# Patient Record
Sex: Male | Born: 1955 | Race: White | Hispanic: No | Marital: Single
Health system: Southern US, Community
[De-identification: ages and names within clinical notes are randomized; demographics above are authoritative.]

## PROBLEM LIST (undated history)

## (undated) DIAGNOSIS — S129XXA Fracture of neck, unspecified, initial encounter: Secondary | ICD-10-CM

## (undated) DIAGNOSIS — J181 Lobar pneumonia, unspecified organism: Secondary | ICD-10-CM

## (undated) DIAGNOSIS — S069X9A Unspecified intracranial injury with loss of consciousness of unspecified duration, initial encounter: Secondary | ICD-10-CM

## (undated) DIAGNOSIS — S066X9A Traumatic subarachnoid hemorrhage with loss of consciousness of unspecified duration, initial encounter: Secondary | ICD-10-CM

## (undated) DIAGNOSIS — J9621 Acute and chronic respiratory failure with hypoxia: Secondary | ICD-10-CM

---

## 2019-02-17 ENCOUNTER — Other Ambulatory Visit (HOSPITAL_COMMUNITY): Payer: BC Managed Care – PPO

## 2019-02-17 ENCOUNTER — Inpatient Hospital Stay
Admission: AD | Admit: 2019-02-17 | Discharge: 2019-04-16 | Disposition: A | Discharge status: Skilled Nursing Facility | Payer: BC Managed Care – PPO | Source: Other Acute Inpatient Hospital | Attending: Internal Medicine | Admitting: Internal Medicine

## 2019-02-17 DIAGNOSIS — J181 Lobar pneumonia, unspecified organism: Secondary | ICD-10-CM | POA: Diagnosis present

## 2019-02-17 DIAGNOSIS — S069X9A Unspecified intracranial injury with loss of consciousness of unspecified duration, initial encounter: Secondary | ICD-10-CM | POA: Diagnosis present

## 2019-02-17 DIAGNOSIS — S129XXA Fracture of neck, unspecified, initial encounter: Secondary | ICD-10-CM | POA: Diagnosis present

## 2019-02-17 DIAGNOSIS — S066X9A Traumatic subarachnoid hemorrhage with loss of consciousness of unspecified duration, initial encounter: Secondary | ICD-10-CM | POA: Diagnosis present

## 2019-02-17 DIAGNOSIS — J189 Pneumonia, unspecified organism: Secondary | ICD-10-CM

## 2019-02-17 DIAGNOSIS — R509 Fever, unspecified: Secondary | ICD-10-CM

## 2019-02-17 DIAGNOSIS — Z431 Encounter for attention to gastrostomy: Secondary | ICD-10-CM

## 2019-02-17 DIAGNOSIS — T85598A Other mechanical complication of other gastrointestinal prosthetic devices, implants and grafts, initial encounter: Secondary | ICD-10-CM

## 2019-02-17 DIAGNOSIS — J9621 Acute and chronic respiratory failure with hypoxia: Secondary | ICD-10-CM | POA: Diagnosis present

## 2019-02-17 DIAGNOSIS — Z4659 Encounter for fitting and adjustment of other gastrointestinal appliance and device: Secondary | ICD-10-CM

## 2019-02-17 HISTORY — DX: Lobar pneumonia, unspecified organism: J18.1

## 2019-02-17 HISTORY — DX: Fracture of neck, unspecified, initial encounter: S12.9XXA

## 2019-02-17 HISTORY — DX: Acute and chronic respiratory failure with hypoxia: J96.21

## 2019-02-17 HISTORY — DX: Traumatic subarachnoid hemorrhage with loss of consciousness of unspecified duration, initial encounter: S06.6X9A

## 2019-02-17 HISTORY — DX: Unspecified intracranial injury with loss of consciousness of unspecified duration, initial encounter: S06.9X9A

## 2019-02-17 LAB — BLOOD GAS, ARTERIAL
Acid-Base Excess: 0.5 mmol/L (ref 0.0–2.0)
Bicarbonate: 23.8 mmol/L (ref 20.0–28.0)
FIO2: 28
O2 Saturation: 91.7 %
Patient temperature: 98.6
pCO2 arterial: 33.7 mmHg (ref 32.0–48.0)
pH, Arterial: 7.463 — ABNORMAL HIGH (ref 7.350–7.450)
pO2, Arterial: 64.9 mmHg — ABNORMAL LOW (ref 83.0–108.0)

## 2019-02-18 ENCOUNTER — Other Ambulatory Visit (HOSPITAL_COMMUNITY): Payer: BC Managed Care – PPO

## 2019-02-18 LAB — CBC WITH DIFFERENTIAL/PLATELET
Abs Immature Granulocytes: 0.06 10*3/uL (ref 0.00–0.07)
Basophils Absolute: 0 10*3/uL (ref 0.0–0.1)
Basophils Relative: 0 %
Eosinophils Absolute: 0.3 10*3/uL (ref 0.0–0.5)
Eosinophils Relative: 4 %
HCT: 31.8 % — ABNORMAL LOW (ref 39.0–52.0)
Hemoglobin: 10 g/dL — ABNORMAL LOW (ref 13.0–17.0)
Immature Granulocytes: 1 %
Lymphocytes Relative: 13 %
Lymphs Abs: 0.8 10*3/uL (ref 0.7–4.0)
MCH: 27.7 pg (ref 26.0–34.0)
MCHC: 31.4 g/dL (ref 30.0–36.0)
MCV: 88.1 fL (ref 80.0–100.0)
Monocytes Absolute: 0.4 10*3/uL (ref 0.1–1.0)
Monocytes Relative: 6 %
Neutro Abs: 5.1 10*3/uL (ref 1.7–7.7)
Neutrophils Relative %: 76 %
Platelets: 376 10*3/uL (ref 150–400)
RBC: 3.61 MIL/uL — ABNORMAL LOW (ref 4.22–5.81)
RDW: 15.3 % (ref 11.5–15.5)
WBC: 6.7 10*3/uL (ref 4.0–10.5)
nRBC: 0.3 % — ABNORMAL HIGH (ref 0.0–0.2)

## 2019-02-18 LAB — URINALYSIS, ROUTINE W REFLEX MICROSCOPIC
Bilirubin Urine: NEGATIVE
Glucose, UA: NEGATIVE mg/dL
Ketones, ur: NEGATIVE mg/dL
Leukocytes,Ua: NEGATIVE
Nitrite: NEGATIVE
Protein, ur: NEGATIVE mg/dL
Specific Gravity, Urine: 1.025 (ref 1.005–1.030)
pH: 6.5 (ref 5.0–8.0)

## 2019-02-18 LAB — C DIFFICILE QUICK SCREEN W PCR REFLEX
C Diff antigen: NEGATIVE
C Diff interpretation: NOT DETECTED
C Diff toxin: NEGATIVE

## 2019-02-18 LAB — URINALYSIS, MICROSCOPIC (REFLEX)
Bacteria, UA: NONE SEEN
WBC, UA: NONE SEEN WBC/hpf (ref 0–5)

## 2019-02-18 LAB — COMPREHENSIVE METABOLIC PANEL
ALT: 27 U/L (ref 0–44)
AST: 24 U/L (ref 15–41)
Albumin: 2.2 g/dL — ABNORMAL LOW (ref 3.5–5.0)
Alkaline Phosphatase: 88 U/L (ref 38–126)
Anion gap: 9 (ref 5–15)
BUN: 31 mg/dL — ABNORMAL HIGH (ref 8–23)
CO2: 21 mmol/L — ABNORMAL LOW (ref 22–32)
Calcium: 8.4 mg/dL — ABNORMAL LOW (ref 8.9–10.3)
Chloride: 107 mmol/L (ref 98–111)
Creatinine, Ser: 1.33 mg/dL — ABNORMAL HIGH (ref 0.61–1.24)
GFR calc Af Amer: >60 mL/min (ref >60)
GFR calc non Af Amer: 56 mL/min — ABNORMAL LOW (ref >60)
Glucose, Bld: 163 mg/dL — ABNORMAL HIGH (ref 70–99)
Potassium: 4.8 mmol/L (ref 3.5–5.1)
Sodium: 137 mmol/L (ref 135–145)
Total Bilirubin: 0.7 mg/dL (ref 0.3–1.2)
Total Protein: 6.9 g/dL (ref 6.5–8.1)

## 2019-02-18 LAB — MAGNESIUM: Magnesium: 2.4 mg/dL (ref 1.7–2.4)

## 2019-02-18 LAB — HEMOGLOBIN A1C
Hgb A1c MFr Bld: 6.2 % — ABNORMAL HIGH (ref 4.8–5.6)
Mean Plasma Glucose: 131.24 mg/dL

## 2019-02-18 LAB — PROTIME-INR
INR: 1.1 (ref 0.8–1.2)
Prothrombin Time: 14.5 seconds (ref 11.4–15.2)

## 2019-02-18 LAB — PHOSPHORUS: Phosphorus: 4 mg/dL (ref 2.5–4.6)

## 2019-02-19 ENCOUNTER — Encounter: Payer: Self-pay | Admitting: Internal Medicine

## 2019-02-19 DIAGNOSIS — J9621 Acute and chronic respiratory failure with hypoxia: Secondary | ICD-10-CM | POA: Diagnosis not present

## 2019-02-19 DIAGNOSIS — S069X9A Unspecified intracranial injury with loss of consciousness of unspecified duration, initial encounter: Secondary | ICD-10-CM | POA: Diagnosis not present

## 2019-02-19 DIAGNOSIS — S066X9S Traumatic subarachnoid hemorrhage with loss of consciousness of unspecified duration, sequela: Secondary | ICD-10-CM

## 2019-02-19 DIAGNOSIS — J181 Lobar pneumonia, unspecified organism: Secondary | ICD-10-CM | POA: Diagnosis present

## 2019-02-19 DIAGNOSIS — S129XXA Fracture of neck, unspecified, initial encounter: Secondary | ICD-10-CM | POA: Diagnosis present

## 2019-02-19 DIAGNOSIS — S129XXS Fracture of neck, unspecified, sequela: Secondary | ICD-10-CM | POA: Diagnosis not present

## 2019-02-19 DIAGNOSIS — S066X9A Traumatic subarachnoid hemorrhage with loss of consciousness of unspecified duration, initial encounter: Secondary | ICD-10-CM | POA: Diagnosis present

## 2019-02-19 LAB — URINE CULTURE: Culture: NO GROWTH

## 2019-02-19 LAB — VANCOMYCIN, TROUGH: Vancomycin Tr: 12 ug/mL — ABNORMAL LOW (ref 15–20)

## 2019-02-19 MED ORDER — CARBOXYMETHYLCELLULOSE SODIUM 0.5 % OP SOLN
1.00 | OPHTHALMIC | Status: DC
Start: ? — End: 2019-02-19

## 2019-02-19 MED ORDER — ENOXAPARIN SODIUM 30 MG/0.3ML ~~LOC~~ SOLN
30.00 | SUBCUTANEOUS | Status: DC
Start: 2019-02-17 — End: 2019-02-19

## 2019-02-19 MED ORDER — TERAZOSIN HCL 1 MG PO CAPS
1.00 | ORAL_CAPSULE | ORAL | Status: DC
Start: 2019-02-17 — End: 2019-02-19

## 2019-02-19 MED ORDER — SULFAMETHOXAZOLE-TRIMETHOPRIM 800-160 MG PO TABS
1.00 | ORAL_TABLET | ORAL | Status: DC
Start: 2019-02-17 — End: 2019-02-19

## 2019-02-19 MED ORDER — ATORVASTATIN CALCIUM 10 MG PO TABS
10.00 | ORAL_TABLET | ORAL | Status: DC
Start: 2019-02-17 — End: 2019-02-19

## 2019-02-19 MED ORDER — OXYCODONE-ACETAMINOPHEN 5-325 MG PO TABS
1.00 | ORAL_TABLET | ORAL | Status: DC
Start: ? — End: 2019-02-19

## 2019-02-19 MED ORDER — ARMODAFINIL 150 MG PO TABS
150.00 | ORAL_TABLET | ORAL | Status: DC
Start: 2019-02-18 — End: 2019-02-19

## 2019-02-19 MED ORDER — GENERIC EXTERNAL MEDICATION
Status: DC
Start: 2019-02-17 — End: 2019-02-19

## 2019-02-19 MED ORDER — SULFAMETHOXAZOLE-TRIMETHOPRIM 800-160 MG PO TABS
2.00 | ORAL_TABLET | ORAL | Status: DC
Start: 2019-02-17 — End: 2019-02-19

## 2019-02-19 MED ORDER — INSULIN REGULAR HUMAN 100 UNIT/ML IJ SOLN
1.00 | INTRAMUSCULAR | Status: DC
Start: 2019-02-17 — End: 2019-02-19

## 2019-02-19 MED ORDER — ACETAMINOPHEN 325 MG PO TABS
325.00 | ORAL_TABLET | ORAL | Status: DC
Start: 2019-02-17 — End: 2019-02-19

## 2019-02-19 MED ORDER — GENERIC EXTERNAL MEDICATION
1.00 | Status: DC
Start: 2019-02-17 — End: 2019-02-19

## 2019-02-19 MED ORDER — INSULIN REGULAR HUMAN 100 UNIT/ML IJ SOLN
8.00 | INTRAMUSCULAR | Status: DC
Start: 2019-02-17 — End: 2019-02-19

## 2019-02-19 MED ORDER — GABAPENTIN 100 MG PO CAPS
100.00 | ORAL_CAPSULE | ORAL | Status: DC
Start: 2019-02-17 — End: 2019-02-19

## 2019-02-19 MED ORDER — DEXTROSE 10 % IV SOLN
125.00 | INTRAVENOUS | Status: DC
Start: ? — End: 2019-02-19

## 2019-02-19 MED ORDER — SENNOSIDES-DOCUSATE SODIUM 8.6-50 MG PO TABS
2.00 | ORAL_TABLET | ORAL | Status: DC
Start: 2019-02-17 — End: 2019-02-19

## 2019-02-19 MED ORDER — BROMOCRIPTINE MESYLATE 2.5 MG PO TABS
2.50 | ORAL_TABLET | ORAL | Status: DC
Start: 2019-02-17 — End: 2019-02-19

## 2019-02-19 MED ORDER — CHLORHEXIDINE GLUCONATE 0.12 % MT SOLN
15.00 | OROMUCOSAL | Status: DC
Start: 2019-02-17 — End: 2019-02-19

## 2019-02-19 MED ORDER — PROPRANOLOL HCL 40 MG PO TABS
40.00 | ORAL_TABLET | ORAL | Status: DC
Start: 2019-02-17 — End: 2019-02-19

## 2019-02-19 MED ORDER — ALBUTEROL SULFATE (2.5 MG/3ML) 0.083% IN NEBU
2.50 | INHALATION_SOLUTION | RESPIRATORY_TRACT | Status: DC
Start: ? — End: 2019-02-19

## 2019-02-19 MED ORDER — INSULIN GLARGINE 100 UNIT/ML SOLOSTAR PEN
12.00 | PEN_INJECTOR | SUBCUTANEOUS | Status: DC
Start: 2019-02-17 — End: 2019-02-19

## 2019-02-19 MED ORDER — POLYETHYLENE GLYCOL 3350 17 G PO PACK
17.00 | PACK | ORAL | Status: DC
Start: 2019-02-18 — End: 2019-02-19

## 2019-02-19 MED ORDER — GENERIC EXTERNAL MEDICATION
Status: DC
Start: ? — End: 2019-02-19

## 2019-02-19 MED ORDER — TRAMADOL HCL 50 MG PO TABS
50.00 | ORAL_TABLET | ORAL | Status: DC
Start: ? — End: 2019-02-19

## 2019-02-19 MED ORDER — GENERIC EXTERNAL MEDICATION
40.00 | Status: DC
Start: 2019-02-18 — End: 2019-02-19

## 2019-02-19 NOTE — Consult Note (Signed)
Pulmonary [place]  PULMONARY SERVICE  Date of Service: 02/19/2019  PULMONARY CRITICAL CARE Luke Clayton  Luke Clayton  DOB: [birth year]   DOA: 02/17/2019  Referring Physician: Merton Border, MD  HPI: Luke Clayton is a 63 y.o. male seen for follow up of Acute on Chronic Respiratory Failure.  Patient has medical history significant for hypertension hyperlipidemia morbid obesity came into the hospital after falling from his tractor trailer and suffering multiple injuries.  Patient at that time was sent for altered mental seizure and mental status was altered.  Patient had a prolonged hospital course was seen by neurology initial evaluation had a acute subarachnoid hemorrhage and patient displaced fractures of the cervical spine.  Patient was intubated underwent trauma evaluation and management.  Subsequently failed to extubate required tracheostomy and presents now for further management and weaning in our facility.  Review of Systems:  ROS performed and is unremarkable other than noted above.  Past Medical History:  Diagnosis Date  . Arthritis  . Coronary artery disease  . Gout  . Hypertension   Past Surgical History:  Procedure Laterality Date  . CHOLECYSTECTOMY  . EYE SURGERY   No family history on file. Social History   Socioeconomic History  . Marital status: Not on file  Spouse name: Not on file  . Number of children: Not on file  . Years of education: Not on file  . Highest education level: Not on file  Occupational History  . Not on file  Social Needs  . Financial resource strain: Not on file  . Food insecurity  Worry: Not on file  Inability: Not on file  . Transportation needs  Medical: Not on file  Non-medical: Not on file  Tobacco Use  . Smoking status: Former Research scientist (life sciences)  . Smokeless tobacco: Former Network engineer and Sexual Activity  . Alcohol use: Yes  Frequency: 2-4 times a month  Drinks per session: 1  or 2  Binge frequency: Less than monthly  . Drug use: Not Currently     Medications: Reviewed on Rounds  Physical Exam:  Vitals: Temperature 97.5 pulse 89 respiratory rate 24 blood pressure 129/70 saturations 93%  Ventilator Settings off the ventilator on T collar with a 28% FiO2  . General: Comfortable at this time . Eyes: Grossly normal lids, irises & conjunctiva . ENT: grossly tongue is normal . Neck: no obvious mass . Cardiovascular: S1-S2 normal no gallop . Respiratory: No rhonchi coarse breath sounds . Abdomen: Soft nontender . Skin: no rash seen on limited exam . Musculoskeletal: not rigid . Psychiatric:unable to assess . Neurologic: no seizure no involuntary movements         Labs on Admission:  Basic Metabolic Panel: Recent Labs  Lab 02/18/19 0604  NA 137  K 4.8  CL 107  CO2 21*  GLUCOSE 163*  BUN 31*  CREATININE 1.33*  CALCIUM 8.4*  MG 2.4  PHOS 4.0    Recent Labs  Lab 02/17/19 1745  PHART 7.463*  PCO2ART 33.7  PO2ART 64.9*  HCO3 23.8  O2SAT 91.7    Liver Function Tests: Recent Labs  Lab 02/18/19 0604  AST 24  ALT 27  ALKPHOS 88  BILITOT 0.7  PROT 6.9  ALBUMIN 2.2*   No results for input(s): LIPASE, AMYLASE in the last 168 hours. No results for input(s): AMMONIA in the last 168 hours.  CBC: Recent Labs  Lab 02/18/19 0604  WBC 6.7  NEUTROABS 5.1  HGB  10.0*  HCT 31.8*  MCV 88.1  PLT 376    Cardiac Enzymes: No results for input(s): CKTOTAL, CKMB, CKMBINDEX, TROPONINI in the last 168 hours.  BNP (last 3 results) No results for input(s): BNP in the last 8760 hours.  ProBNP (last 3 results) No results for input(s): PROBNP in the last 8760 hours.   Radiological Exams on Admission: Dg Abd 1 View  Result Date: 02/17/2019 CLINICAL DATA:  Nasogastric tube placement. EXAM: ABDOMEN - 1 VIEW COMPARISON:  None. FINDINGS: The enteric tube projects over the first portion the duodenum. The tip is pointed distally. The bowel gas  pattern is nonobstructive and nonspecific. IMPRESSION: Enteric tube tip projects over the first portion of the duodenum. Electronically Signed   By: Katherine Mantlehristopher  Green M.D.   On: 02/17/2019 18:06   Dg Chest Port 1 View  Result Date: 02/18/2019 CLINICAL DATA:  Pneumonia EXAM: PORTABLE CHEST 1 VIEW COMPARISON:  None. FINDINGS: Left perihilar streaky opacity with obscured left diaphragm. Tracheostomy tube in place. There is a feeding tube which at least reaches the diaphragm. Remote right rib fractures. IMPRESSION: Left basal and perihilar atelectasis or pneumonia. Electronically Signed   By: Marnee SpringJonathon  Watts M.D.   On: 02/18/2019 08:04    Assessment/Plan Active Problems:   Acute on chronic respiratory failure with hypoxia (HCC)   Traumatic subarachnoid hemorrhage with loss of consciousness (HCC)   Lobar pneumonia, unspecified organism (HCC)   Multiple closed fractures of cervical vertebrae (HCC)   Traumatic brain injury with loss of consciousness of one hour or more (HCC)   1. Acute on chronic respiratory failure with hypoxia currently is tolerating the weaning has been on T collar will continue to advance weaning as tolerated right now is requiring 28% FiO2. 2. Subarachnoid hemorrhage cleared by neurology at the other facility.  We will continue with supportive care physical therapy as necessary. 3. Lobar pneumonia last chest x-ray showing some atelectasis versus pneumonia patient has been treated we will follow-up radiologically. 4. Multiple cervical fractures patient remains in c-collar without continue with supportive care 5. Traumatic brain injury will need ongoing follow-up  I have personally seen and evaluated the patient, evaluated laboratory and imaging results, formulated the assessment and plan and placed orders. The Patient requires high complexity decision making for assessment and support.  Case was discussed on Rounds with the Respiratory Therapy Staff Time Spent  70minutes  Yevonne PaxSaadat A Tami Blass, MD [facility] Pulmonary Critical Care Medicine Sleep Medicine

## 2019-02-20 DIAGNOSIS — S129XXS Fracture of neck, unspecified, sequela: Secondary | ICD-10-CM | POA: Diagnosis not present

## 2019-02-20 DIAGNOSIS — J9621 Acute and chronic respiratory failure with hypoxia: Secondary | ICD-10-CM | POA: Diagnosis not present

## 2019-02-20 DIAGNOSIS — J181 Lobar pneumonia, unspecified organism: Secondary | ICD-10-CM | POA: Diagnosis not present

## 2019-02-20 DIAGNOSIS — S069X9A Unspecified intracranial injury with loss of consciousness of unspecified duration, initial encounter: Secondary | ICD-10-CM | POA: Diagnosis not present

## 2019-02-20 LAB — RENAL FUNCTION PANEL
Albumin: 2.1 g/dL — ABNORMAL LOW (ref 3.5–5.0)
Anion gap: 10 (ref 5–15)
BUN: 36 mg/dL — ABNORMAL HIGH (ref 8–23)
CO2: 19 mmol/L — ABNORMAL LOW (ref 22–32)
Calcium: 8.6 mg/dL — ABNORMAL LOW (ref 8.9–10.3)
Chloride: 106 mmol/L (ref 98–111)
Creatinine, Ser: 1.3 mg/dL — ABNORMAL HIGH (ref 0.61–1.24)
GFR calc Af Amer: >60 mL/min (ref >60)
GFR calc non Af Amer: 58 mL/min — ABNORMAL LOW (ref >60)
Glucose, Bld: 183 mg/dL — ABNORMAL HIGH (ref 70–99)
Phosphorus: 3.4 mg/dL (ref 2.5–4.6)
Potassium: 4.7 mmol/L (ref 3.5–5.1)
Sodium: 135 mmol/L (ref 135–145)

## 2019-02-20 LAB — CBC
HCT: 31.7 % — ABNORMAL LOW (ref 39.0–52.0)
Hemoglobin: 9.9 g/dL — ABNORMAL LOW (ref 13.0–17.0)
MCH: 27.7 pg (ref 26.0–34.0)
MCHC: 31.2 g/dL (ref 30.0–36.0)
MCV: 88.8 fL (ref 80.0–100.0)
Platelets: 353 10*3/uL (ref 150–400)
RBC: 3.57 MIL/uL — ABNORMAL LOW (ref 4.22–5.81)
RDW: 16.3 % — ABNORMAL HIGH (ref 11.5–15.5)
WBC: 8.3 10*3/uL (ref 4.0–10.5)
nRBC: 0 % (ref 0.0–0.2)

## 2019-02-20 LAB — CULTURE, RESPIRATORY W GRAM STAIN: Culture: NORMAL

## 2019-02-20 LAB — VANCOMYCIN, TROUGH: Vancomycin Tr: 17 ug/mL (ref 15–20)

## 2019-02-20 LAB — MAGNESIUM: Magnesium: 2.4 mg/dL (ref 1.7–2.4)

## 2019-02-20 NOTE — Progress Notes (Addendum)
Pulmonary Critical Care Medicine [name]   PULMONARY CRITICAL CARE SERVICE  PROGRESS NOTE  Date of Service: 02/20/2019  Luke Clayton  [id number]:[id number]  DOB: May 31, 1956   DOA: 02/17/2019  Referring Physician: Merton Border, MD  HPI: Luke Clayton is a 63 y.o. male seen for follow up of Acute on Chronic Respiratory Failure.  Patient continues on aerosol trach collar 28% FiO2 resting on pressure support at night.  Medications: Reviewed on Rounds  Physical Exam:  Vitals: Pulse 96 respirations 36 BP 145/85 O2 sat 93% temp 99.8  Ventilator Settings ATC 28%  . General: Comfortable at this time . Eyes: Grossly normal lids, irises & conjunctiva . ENT: grossly tongue is normal . Neck: no obvious mass . Cardiovascular: S1 S2 normal no gallop . Respiratory: No rales or rhonchi noted . Abdomen: soft . Skin: no rash seen on limited exam . Musculoskeletal: not rigid . Psychiatric:unable to assess . Neurologic: no seizure no involuntary movements         Lab Data:   Basic Metabolic Panel: Recent Labs  Lab 02/18/19 0604 02/20/19 0208  NA 137 135  K 4.8 4.7  CL 107 106  CO2 21* 19*  GLUCOSE 163* 183*  BUN 31* 36*  CREATININE 1.33* 1.30*  CALCIUM 8.4* 8.6*  MG 2.4 2.4  PHOS 4.0 3.4    ABG: Recent Labs  Lab 02/17/19 1745  PHART 7.463*  PCO2ART 33.7  PO2ART 64.9*  HCO3 23.8  O2SAT 91.7    Liver Function Tests: Recent Labs  Lab 02/18/19 0604 02/20/19 0208  AST 24  --   ALT 27  --   ALKPHOS 88  --   BILITOT 0.7  --   PROT 6.9  --   ALBUMIN 2.2* 2.1*   No results for input(s): LIPASE, AMYLASE in the last 168 hours. No results for input(s): AMMONIA in the last 168 hours.  CBC: Recent Labs  Lab 02/18/19 0604 02/20/19 0208  WBC 6.7 8.3  NEUTROABS 5.1  --   HGB 10.0* 9.9*  HCT 31.8* 31.7*  MCV 88.1 88.8  PLT 376 353    Cardiac Enzymes: No results for input(s): CKTOTAL, CKMB, CKMBINDEX, TROPONINI in the last 168 hours.  BNP (last 3  results) No results for input(s): BNP in the last 8760 hours.  ProBNP (last 3 results) No results for input(s): PROBNP in the last 8760 hours.  Radiological Exams: No results found.  Assessment/Plan Active Problems:   Acute on chronic respiratory failure with hypoxia (HCC)   Traumatic subarachnoid hemorrhage with loss of consciousness (HCC)   Lobar pneumonia, unspecified organism ([place])   Multiple closed fractures of cervical vertebrae (HCC)   Traumatic brain injury with loss of consciousness of one hour or more ([place])   1. Acute on chronic respiratory failure with hypoxia currently is tolerating the weaning has been on T collar will continue to advance weaning as tolerated right now is requiring 28% FiO2. 2. Subarachnoid hemorrhage cleared by neurology at the other facility.  We will continue with supportive care physical therapy as necessary. 3. Lobar pneumonia last chest x-ray showing some atelectasis versus pneumonia patient has been treated we will follow-up radiologically. 4. Multiple cervical fractures patient remains in c-collar without continue with supportive care 5. Traumatic brain injury will need ongoing follow-up   I have personally seen and evaluated the patient, evaluated laboratory and imaging results, formulated the assessment and plan and placed orders. The Patient requires high complexity decision making for assessment and support.  Case was discussed on Rounds with the Respiratory Therapy Staff  Allyne Gee, MD [facility] Pulmonary Critical Care Medicine Sleep Medicine

## 2019-02-21 DIAGNOSIS — S069X9A Unspecified intracranial injury with loss of consciousness of unspecified duration, initial encounter: Secondary | ICD-10-CM | POA: Diagnosis not present

## 2019-02-21 DIAGNOSIS — J181 Lobar pneumonia, unspecified organism: Secondary | ICD-10-CM | POA: Diagnosis not present

## 2019-02-21 DIAGNOSIS — S129XXS Fracture of neck, unspecified, sequela: Secondary | ICD-10-CM | POA: Diagnosis not present

## 2019-02-21 DIAGNOSIS — J9621 Acute and chronic respiratory failure with hypoxia: Secondary | ICD-10-CM | POA: Diagnosis not present

## 2019-02-21 LAB — MAGNESIUM: Magnesium: 2.3 mg/dL (ref 1.7–2.4)

## 2019-02-21 LAB — RENAL FUNCTION PANEL
Albumin: 2 g/dL — ABNORMAL LOW (ref 3.5–5.0)
Anion gap: 9 (ref 5–15)
BUN: 38 mg/dL — ABNORMAL HIGH (ref 8–23)
CO2: 21 mmol/L — ABNORMAL LOW (ref 22–32)
Calcium: 8.6 mg/dL — ABNORMAL LOW (ref 8.9–10.3)
Chloride: 107 mmol/L (ref 98–111)
Creatinine, Ser: 1.27 mg/dL — ABNORMAL HIGH (ref 0.61–1.24)
GFR calc Af Amer: >60 mL/min (ref >60)
GFR calc non Af Amer: 60 mL/min — ABNORMAL LOW (ref >60)
Glucose, Bld: 216 mg/dL — ABNORMAL HIGH (ref 70–99)
Phosphorus: 3.6 mg/dL (ref 2.5–4.6)
Potassium: 4.5 mmol/L (ref 3.5–5.1)
Sodium: 137 mmol/L (ref 135–145)

## 2019-02-21 LAB — CBC
HCT: 30.7 % — ABNORMAL LOW (ref 39.0–52.0)
Hemoglobin: 9.3 g/dL — ABNORMAL LOW (ref 13.0–17.0)
MCH: 27.7 pg (ref 26.0–34.0)
MCHC: 30.3 g/dL (ref 30.0–36.0)
MCV: 91.4 fL (ref 80.0–100.0)
Platelets: 300 10*3/uL (ref 150–400)
RBC: 3.36 MIL/uL — ABNORMAL LOW (ref 4.22–5.81)
RDW: 16.4 % — ABNORMAL HIGH (ref 11.5–15.5)
WBC: 7.2 10*3/uL (ref 4.0–10.5)
nRBC: 0 % (ref 0.0–0.2)

## 2019-02-21 NOTE — Progress Notes (Addendum)
Pulmonary Critical Care Medicine [place]   PULMONARY CRITICAL CARE SERVICE  PROGRESS NOTE  Date of Service: 02/21/2019  Luke Clayton  [id number]  DOB: [birth year]   DOA: 02/17/2019  Referring Physician: Merton Border, MD  HPI: Luke Clayton is a 63 y.o. male seen for follow up of Acute on Chronic Respiratory Failure.  Patient remains on aerosol trach collar 28% during the day resting on pressure support 12/5 28% at night.  Medications: Reviewed on Rounds  Physical Exam:  Vitals: Pulse 96 respirations 30 BP 127/76 O2 sat 96% temp 98.9  Ventilator Settings AC 20% today per support 12/5 FiO2 28%  . General: Comfortable at this time . Eyes: Grossly normal lids, irises & conjunctiva . ENT: grossly tongue is normal . Neck: no obvious mass . Cardiovascular: S1 S2 normal no gallop . Respiratory: No rales or rhonchi noted . Abdomen: soft . Skin: no rash seen on limited exam . Musculoskeletal: not rigid . Psychiatric:unable to assess . Neurologic: no seizure no involuntary movements         Lab Data:   Basic Metabolic Panel: Recent Labs  Lab 02/18/19 0604 02/20/19 0208 02/21/19 0632  NA 137 135 137  K 4.8 4.7 4.5  CL 107 106 107  CO2 21* 19* 21*  GLUCOSE 163* 183* 216*  BUN 31* 36* 38*  CREATININE 1.33* 1.30* 1.27*  CALCIUM 8.4* 8.6* 8.6*  MG 2.4 2.4 2.3  PHOS 4.0 3.4 3.6    ABG: Recent Labs  Lab 02/17/19 1745  PHART 7.463*  PCO2ART 33.7  PO2ART 64.9*  HCO3 23.8  O2SAT 91.7    Liver Function Tests: Recent Labs  Lab 02/18/19 0604 02/20/19 0208 02/21/19 0632  AST 24  --   --   ALT 27  --   --   ALKPHOS 88  --   --   BILITOT 0.7  --   --   PROT 6.9  --   --   ALBUMIN 2.2* 2.1* 2.0*   No results for input(s): LIPASE, AMYLASE in the last 168 hours. No results for input(s): AMMONIA in the last 168 hours.  CBC: Recent Labs  Lab 02/18/19 0604 02/20/19 0208 02/21/19 0632  WBC 6.7 8.3 7.2  NEUTROABS 5.1  --   --   HGB 10.0*  9.9* 9.3*  HCT 31.8* 31.7* 30.7*  MCV 88.1 88.8 91.4  PLT 376 353 300    Cardiac Enzymes: No results for input(s): CKTOTAL, CKMB, CKMBINDEX, TROPONINI in the last 168 hours.  BNP (last 3 results) No results for input(s): BNP in the last 8760 hours.  ProBNP (last 3 results) No results for input(s): PROBNP in the last 8760 hours.  Radiological Exams: No results found.  Assessment/Plan Active Problems:   Acute on chronic respiratory failure with hypoxia (HCC)   Traumatic subarachnoid hemorrhage with loss of consciousness (HCC)   Lobar pneumonia, unspecified organism ([place])   Multiple closed fractures of cervical vertebrae (HCC)   Traumatic brain injury with loss of consciousness of one hour or more ([place])   1. Acute on chronic respiratory failure with hypoxia we will continue with the T collar will titrate oxygen continue pulmonary toilet 2. Traumatic subarachnoid hemorrhage unchanged we will continue with supportive care 3. Lobar pneumonia treated clinically improved 4. Multiple fractures status post Ortho repair 5. Traumatic brain injury grossly unchanged continue with present management   I have personally seen and evaluated the patient, evaluated laboratory and imaging results, formulated the assessment and plan and placed  orders. The Patient requires high complexity decision making for assessment and support.  Case was discussed on Rounds with the Respiratory Therapy Staff  Allyne Gee, MD [facility] Pulmonary Critical Care Medicine Sleep Medicine

## 2019-02-22 DIAGNOSIS — J9621 Acute and chronic respiratory failure with hypoxia: Secondary | ICD-10-CM | POA: Diagnosis not present

## 2019-02-22 DIAGNOSIS — S129XXS Fracture of neck, unspecified, sequela: Secondary | ICD-10-CM | POA: Diagnosis not present

## 2019-02-22 DIAGNOSIS — S069X9A Unspecified intracranial injury with loss of consciousness of unspecified duration, initial encounter: Secondary | ICD-10-CM | POA: Diagnosis not present

## 2019-02-22 DIAGNOSIS — J181 Lobar pneumonia, unspecified organism: Secondary | ICD-10-CM | POA: Diagnosis not present

## 2019-02-22 NOTE — Progress Notes (Signed)
Pulmonary Critical Care Medicine [place]   PULMONARY CRITICAL CARE SERVICE  PROGRESS NOTE  Date of Service: 02/22/2019  Luke Clayton  [id number]  DOB: [birth year]   DOA: 02/17/2019  Referring Physician: Merton Border, MD  HPI: Luke Clayton is a 63 y.o. male seen for follow up of Acute on Chronic Respiratory Failure.  Patient currently is on T collar has been on 28% FiO2 using pressure support at night  Medications: Reviewed on Rounds  Physical Exam:  Vitals: Temperature 98.8 pulse 91 respiratory rate 36 blood pressure is 125/70 saturations 96%  Ventilator Settings currently on T collar FiO2 is 28%  . General: Comfortable at this time . Eyes: Grossly normal lids, irises & conjunctiva . ENT: grossly tongue is normal . Neck: no obvious mass . Cardiovascular: S1 S2 normal no gallop . Respiratory: No rhonchi no rales are noted at this time . Abdomen: soft . Skin: no rash seen on limited exam . Musculoskeletal: not rigid . Psychiatric:unable to assess . Neurologic: no seizure no involuntary movements         Lab Data:   Basic Metabolic Panel: Recent Labs  Lab 02/18/19 0604 02/20/19 0208 02/21/19 0632  NA 137 135 137  K 4.8 4.7 4.5  CL 107 106 107  CO2 21* 19* 21*  GLUCOSE 163* 183* 216*  BUN 31* 36* 38*  CREATININE 1.33* 1.30* 1.27*  CALCIUM 8.4* 8.6* 8.6*  MG 2.4 2.4 2.3  PHOS 4.0 3.4 3.6    ABG: Recent Labs  Lab 02/17/19 1745  PHART 7.463*  PCO2ART 33.7  PO2ART 64.9*  HCO3 23.8  O2SAT 91.7    Liver Function Tests: Recent Labs  Lab 02/18/19 0604 02/20/19 0208 02/21/19 0632  AST 24  --   --   ALT 27  --   --   ALKPHOS 88  --   --   BILITOT 0.7  --   --   PROT 6.9  --   --   ALBUMIN 2.2* 2.1* 2.0*   No results for input(s): LIPASE, AMYLASE in the last 168 hours. No results for input(s): AMMONIA in the last 168 hours.  CBC: Recent Labs  Lab 02/18/19 0604 02/20/19 0208 02/21/19 0632  WBC 6.7 8.3 7.2  NEUTROABS 5.1   --   --   HGB 10.0* 9.9* 9.3*  HCT 31.8* 31.7* 30.7*  MCV 88.1 88.8 91.4  PLT 376 353 300    Cardiac Enzymes: No results for input(s): CKTOTAL, CKMB, CKMBINDEX, TROPONINI in the last 168 hours.  BNP (last 3 results) No results for input(s): BNP in the last 8760 hours.  ProBNP (last 3 results) No results for input(s): PROBNP in the last 8760 hours.  Radiological Exams: No results found.  Assessment/Plan Active Problems:   Acute on chronic respiratory failure with hypoxia (HCC)   Traumatic subarachnoid hemorrhage with loss of consciousness (HCC)   Lobar pneumonia, unspecified organism ([place])   Multiple closed fractures of cervical vertebrae (HCC)   Traumatic brain injury with loss of consciousness of one hour or more ([place])   1. Acute on chronic respiratory failure with hypoxia we will continue with the T collar will titrate oxygen continue pulmonary toilet 2. Traumatic subarachnoid hemorrhage unchanged we will continue with supportive care 3. Lobar pneumonia treated clinically improved 4. Multiple fractures status post Ortho repair 5. Traumatic brain injury grossly unchanged continue with present management   I have personally seen and evaluated the patient, evaluated laboratory and imaging results, formulated the assessment and  plan and placed orders. The Patient requires high complexity decision making for assessment and support.  Case was discussed on Rounds with the Respiratory Therapy Staff  Allyne Gee, MD [facility] Pulmonary Critical Care Medicine Sleep Medicine

## 2019-02-23 DIAGNOSIS — J181 Lobar pneumonia, unspecified organism: Secondary | ICD-10-CM | POA: Diagnosis not present

## 2019-02-23 DIAGNOSIS — J9621 Acute and chronic respiratory failure with hypoxia: Secondary | ICD-10-CM | POA: Diagnosis not present

## 2019-02-23 DIAGNOSIS — S129XXS Fracture of neck, unspecified, sequela: Secondary | ICD-10-CM | POA: Diagnosis not present

## 2019-02-23 DIAGNOSIS — S069X9A Unspecified intracranial injury with loss of consciousness of unspecified duration, initial encounter: Secondary | ICD-10-CM | POA: Diagnosis not present

## 2019-02-23 LAB — RENAL FUNCTION PANEL
Albumin: 2 g/dL — ABNORMAL LOW (ref 3.5–5.0)
Anion gap: 12 (ref 5–15)
BUN: 30 mg/dL — ABNORMAL HIGH (ref 8–23)
CO2: 20 mmol/L — ABNORMAL LOW (ref 22–32)
Calcium: 9.1 mg/dL (ref 8.9–10.3)
Chloride: 106 mmol/L (ref 98–111)
Creatinine, Ser: 0.93 mg/dL (ref 0.61–1.24)
GFR calc Af Amer: >60 mL/min (ref >60)
GFR calc non Af Amer: >60 mL/min (ref >60)
Glucose, Bld: 210 mg/dL — ABNORMAL HIGH (ref 70–99)
Phosphorus: 4.4 mg/dL (ref 2.5–4.6)
Potassium: 4.4 mmol/L (ref 3.5–5.1)
Sodium: 138 mmol/L (ref 135–145)

## 2019-02-23 LAB — CBC
HCT: 31.2 % — ABNORMAL LOW (ref 39.0–52.0)
Hemoglobin: 9.6 g/dL — ABNORMAL LOW (ref 13.0–17.0)
MCH: 27.4 pg (ref 26.0–34.0)
MCHC: 30.8 g/dL (ref 30.0–36.0)
MCV: 88.9 fL (ref 80.0–100.0)
Platelets: 252 10*3/uL (ref 150–400)
RBC: 3.51 MIL/uL — ABNORMAL LOW (ref 4.22–5.81)
RDW: 15.8 % — ABNORMAL HIGH (ref 11.5–15.5)
WBC: 5.9 10*3/uL (ref 4.0–10.5)
nRBC: 0 % (ref 0.0–0.2)

## 2019-02-23 LAB — MAGNESIUM: Magnesium: 1.9 mg/dL (ref 1.7–2.4)

## 2019-02-23 LAB — URIC ACID: Uric Acid, Serum: 6.1 mg/dL (ref 3.7–8.6)

## 2019-02-23 NOTE — Progress Notes (Addendum)
Pulmonary Critical Care Medicine [facility]   PULMONARY CRITICAL CARE SERVICE  PROGRESS NOTE  Date of Service: 02/23/2019  Luke KocherLouis Clayton  [id number]  DOB: 02/19/1956   DOA: 02/17/2019  Referring Physician: Carron CurieAli Hijazi, MD  HPI: Luke Clayton is a 63 y.o. male seen for follow up of Acute on Chronic Respiratory Failure.  Patient remains on aerosol trach collar 28% FiO2 during the day resting on pressure support at night has a goal of 24 hours today on trach collar.  Medications: Reviewed on Rounds  Physical Exam:  Vitals: Pulse 99 respirations 30 BP 154/83 O2 sat 95% temp 99.3  Ventilator Settings ATC 28%  . General: Comfortable at this time . Eyes: Grossly normal lids, irises & conjunctiva . ENT: grossly tongue is normal . Neck: no obvious mass . Cardiovascular: S1 S2 normal no gallop . Respiratory: No rales or rhonchi noted . Abdomen: soft . Skin: no rash seen on limited exam . Musculoskeletal: not rigid . Psychiatric:unable to assess . Neurologic: no seizure no involuntary movements         Lab Data:   Basic Metabolic Panel: Recent Labs  Lab 02/18/19 0604 02/20/19 0208 02/21/19 0632 02/23/19 0505  NA 137 135 137 138  K 4.8 4.7 4.5 4.4  CL 107 106 107 106  CO2 21* 19* 21* 20*  GLUCOSE 163* 183* 216* 210*  BUN 31* 36* 38* 30*  CREATININE 1.33* 1.30* 1.27* 0.93  CALCIUM 8.4* 8.6* 8.6* 9.1  MG 2.4 2.4 2.3 1.9  PHOS 4.0 3.4 3.6 4.4    ABG: Recent Labs  Lab 02/17/19 1745  PHART 7.463*  PCO2ART 33.7  PO2ART 64.9*  HCO3 23.8  O2SAT 91.7    Liver Function Tests: Recent Labs  Lab 02/18/19 0604 02/20/19 0208 02/21/19 0632 02/23/19 0505  AST 24  --   --   --   ALT 27  --   --   --   ALKPHOS 88  --   --   --   BILITOT 0.7  --   --   --   PROT 6.9  --   --   --   ALBUMIN 2.2* 2.1* 2.0* 2.0*   No results for input(s): LIPASE, AMYLASE in the last 168 hours. No results for input(s): AMMONIA in the last 168 hours.  CBC: Recent  Labs  Lab 02/18/19 0604 02/20/19 0208 02/21/19 0632 02/23/19 0505  WBC 6.7 8.3 7.2 5.9  NEUTROABS 5.1  --   --   --   HGB 10.0* 9.9* 9.3* 9.6*  HCT 31.8* 31.7* 30.7* 31.2*  MCV 88.1 88.8 91.4 88.9  PLT 376 353 300 252    Cardiac Enzymes: No results for input(s): CKTOTAL, CKMB, CKMBINDEX, TROPONINI in the last 168 hours.  BNP (last 3 results) No results for input(s): BNP in the last 8760 hours.  ProBNP (last 3 results) No results for input(s): PROBNP in the last 8760 hours.  Radiological Exams: No results found.  Assessment/Plan Active Problems:   Acute on chronic respiratory failure with hypoxia (HCC)   Traumatic subarachnoid hemorrhage with loss of consciousness (HCC)   Lobar pneumonia, unspecified organism (HCC)   Multiple closed fractures of cervical vertebrae (HCC)   Traumatic brain injury with loss of consciousness of one hour or more (HCC)   1. Acute on chronic respiratory failure with hypoxia we will continue with the T collar will titrate oxygen continue pulmonary toilet 2. Traumatic subarachnoid hemorrhage unchanged we will continue with supportive care 3. Lobar  pneumonia treated clinically improved 4. Multiple fractures status post Ortho repair 5. Traumatic brain injury grossly unchanged continue with present management   I have personally seen and evaluated the patient, evaluated laboratory and imaging results, formulated the assessment and plan and placed orders. The Patient requires high complexity decision making for assessment and support.  Case was discussed on Rounds with the Respiratory Therapy Staff  Allyne Gee, MD [facility] Pulmonary Critical Care Medicine Sleep Medicine

## 2019-02-24 DIAGNOSIS — S069X9A Unspecified intracranial injury with loss of consciousness of unspecified duration, initial encounter: Secondary | ICD-10-CM | POA: Diagnosis not present

## 2019-02-24 DIAGNOSIS — J9621 Acute and chronic respiratory failure with hypoxia: Secondary | ICD-10-CM | POA: Diagnosis not present

## 2019-02-24 DIAGNOSIS — S129XXS Fracture of neck, unspecified, sequela: Secondary | ICD-10-CM | POA: Diagnosis not present

## 2019-02-24 DIAGNOSIS — J181 Lobar pneumonia, unspecified organism: Secondary | ICD-10-CM | POA: Diagnosis not present

## 2019-02-24 LAB — URINALYSIS, ROUTINE W REFLEX MICROSCOPIC
Bilirubin Urine: NEGATIVE
Glucose, UA: NEGATIVE mg/dL
Hgb urine dipstick: NEGATIVE
Ketones, ur: NEGATIVE mg/dL
Leukocytes,Ua: NEGATIVE
Nitrite: NEGATIVE
Protein, ur: 30 mg/dL — AB
Specific Gravity, Urine: 1.023 (ref 1.005–1.030)
pH: 5 (ref 5.0–8.0)

## 2019-02-24 NOTE — Progress Notes (Addendum)
Pulmonary Critical Care Medicine [place]   PULMONARY CRITICAL CARE SERVICE  PROGRESS NOTE  Date of Service: 02/24/2019  Tulio Facundo  [id number]  DOB: 1956-02-11   DOA: 02/17/2019  Referring Physician: Merton Border, MD  HPI: Nabeel Gladson is a 63 y.o. male seen for follow up of Acute on Chronic Respiratory Failure.  Patient has now completed 24 hours on aerosol trach collar 28% FiO2.  Satting well at this time with no distress.  Medications: Reviewed on Rounds  Physical Exam:  Vitals: Pulse 100 respirations 40 BP 123/86 O2 sat 95% temp 99.9  Ventilator Settings ATC 28%  . General: Comfortable at this time . Eyes: Grossly normal lids, irises & conjunctiva . ENT: grossly tongue is normal . Neck: no obvious mass . Cardiovascular: S1 S2 normal no gallop . Respiratory: No rales or rhonchi noted . Abdomen: soft . Skin: no rash seen on limited exam . Musculoskeletal: not rigid . Psychiatric:unable to assess . Neurologic: no seizure no involuntary movements         Lab Data:   Basic Metabolic Panel: Recent Labs  Lab 02/18/19 0604 02/20/19 0208 02/21/19 0632 02/23/19 0505  NA 137 135 137 138  K 4.8 4.7 4.5 4.4  CL 107 106 107 106  CO2 21* 19* 21* 20*  GLUCOSE 163* 183* 216* 210*  BUN 31* 36* 38* 30*  CREATININE 1.33* 1.30* 1.27* 0.93  CALCIUM 8.4* 8.6* 8.6* 9.1  MG 2.4 2.4 2.3 1.9  PHOS 4.0 3.4 3.6 4.4    ABG: Recent Labs  Lab 02/17/19 1745  PHART 7.463*  PCO2ART 33.7  PO2ART 64.9*  HCO3 23.8  O2SAT 91.7    Liver Function Tests: Recent Labs  Lab 02/18/19 0604 02/20/19 0208 02/21/19 0632 02/23/19 0505  AST 24  --   --   --   ALT 27  --   --   --   ALKPHOS 88  --   --   --   BILITOT 0.7  --   --   --   PROT 6.9  --   --   --   ALBUMIN 2.2* 2.1* 2.0* 2.0*   No results for input(s): LIPASE, AMYLASE in the last 168 hours. No results for input(s): AMMONIA in the last 168 hours.  CBC: Recent Labs  Lab 02/18/19 0604  02/20/19 0208 02/21/19 0632 02/23/19 0505  WBC 6.7 8.3 7.2 5.9  NEUTROABS 5.1  --   --   --   HGB 10.0* 9.9* 9.3* 9.6*  HCT 31.8* 31.7* 30.7* 31.2*  MCV 88.1 88.8 91.4 88.9  PLT 376 353 300 252    Cardiac Enzymes: No results for input(s): CKTOTAL, CKMB, CKMBINDEX, TROPONINI in the last 168 hours.  BNP (last 3 results) No results for input(s): BNP in the last 8760 hours.  ProBNP (last 3 results) No results for input(s): PROBNP in the last 8760 hours.  Radiological Exams: No results found.  Assessment/Plan Active Problems:   Acute on chronic respiratory failure with hypoxia (HCC)   Traumatic subarachnoid hemorrhage with loss of consciousness (HCC)   Lobar pneumonia, unspecified organism ([place])   Multiple closed fractures of cervical vertebrae (HCC)   Traumatic brain injury with loss of consciousness of one hour or more ([place])   1. Acute on chronic respiratory failure with hypoxia we will continue with the T collar will titrate oxygen continue pulmonary toilet 2. Traumatic subarachnoid hemorrhage unchanged we will continue with supportive care 3. Lobar pneumonia treated clinically improved 4. Multiple  fractures status post Ortho repair 5. Traumatic brain injury grossly unchanged continue with present management   I have personally seen and evaluated the patient, evaluated laboratory and imaging results, formulated the assessment and plan and placed orders. The Patient requires high complexity decision making for assessment and support.  Case was discussed on Rounds with the Respiratory Therapy Staff  Yevonne PaxSaadat A Dorothee Napierkowski, MD [facility] Pulmonary Critical Care Medicine Sleep Medicine

## 2019-02-25 DIAGNOSIS — J9621 Acute and chronic respiratory failure with hypoxia: Secondary | ICD-10-CM | POA: Diagnosis not present

## 2019-02-25 DIAGNOSIS — J181 Lobar pneumonia, unspecified organism: Secondary | ICD-10-CM | POA: Diagnosis not present

## 2019-02-25 DIAGNOSIS — S069X9A Unspecified intracranial injury with loss of consciousness of unspecified duration, initial encounter: Secondary | ICD-10-CM | POA: Diagnosis not present

## 2019-02-25 DIAGNOSIS — S129XXS Fracture of neck, unspecified, sequela: Secondary | ICD-10-CM | POA: Diagnosis not present

## 2019-02-25 LAB — RENAL FUNCTION PANEL
Albumin: 2 g/dL — ABNORMAL LOW (ref 3.5–5.0)
Anion gap: 11 (ref 5–15)
BUN: 38 mg/dL — ABNORMAL HIGH (ref 8–23)
CO2: 24 mmol/L (ref 22–32)
Calcium: 8.9 mg/dL (ref 8.9–10.3)
Chloride: 105 mmol/L (ref 98–111)
Creatinine, Ser: 0.9 mg/dL (ref 0.61–1.24)
GFR calc Af Amer: >60 mL/min (ref >60)
GFR calc non Af Amer: >60 mL/min (ref >60)
Glucose, Bld: 213 mg/dL — ABNORMAL HIGH (ref 70–99)
Phosphorus: 4.3 mg/dL (ref 2.5–4.6)
Potassium: 3.9 mmol/L (ref 3.5–5.1)
Sodium: 140 mmol/L (ref 135–145)

## 2019-02-25 LAB — URINE CULTURE: Culture: NO GROWTH

## 2019-02-25 LAB — CBC
HCT: 31.8 % — ABNORMAL LOW (ref 39.0–52.0)
Hemoglobin: 9.6 g/dL — ABNORMAL LOW (ref 13.0–17.0)
MCH: 26.7 pg (ref 26.0–34.0)
MCHC: 30.2 g/dL (ref 30.0–36.0)
MCV: 88.6 fL (ref 80.0–100.0)
Platelets: 248 10*3/uL (ref 150–400)
RBC: 3.59 MIL/uL — ABNORMAL LOW (ref 4.22–5.81)
RDW: 15.5 % (ref 11.5–15.5)
WBC: 4.5 10*3/uL (ref 4.0–10.5)
nRBC: 0 % (ref 0.0–0.2)

## 2019-02-25 LAB — MAGNESIUM: Magnesium: 2 mg/dL (ref 1.7–2.4)

## 2019-02-25 NOTE — Progress Notes (Addendum)
Pulmonary Critical Care Medicine [place]   PULMONARY CRITICAL CARE SERVICE  PROGRESS NOTE  Date of Service: 02/25/2019  Luke Clayton  [id number]:[id number]  DOB: 06-24-1956   DOA: 02/17/2019  Referring Physician: Merton Border, MD  HPI: Luke Clayton is a 63 y.o. male seen for follow up of Acute on Chronic Respiratory Failure.  Patient remains on aerosol trach collar 28% FiO2 is completed 48 hours at this time.  Medications: Reviewed on Rounds  Physical Exam:  Vitals: Pulse 82 respirations 29 BP 130/80 O2 sat 95% temp 99.8  Ventilator Settings ATC 28%  . General: Comfortable at this time . Eyes: Grossly normal lids, irises & conjunctiva . ENT: grossly tongue is normal . Neck: no obvious mass . Cardiovascular: S1 S2 normal no gallop . Respiratory: No rales or rhonchi noted . Abdomen: soft . Skin: no rash seen on limited exam . Musculoskeletal: not rigid . Psychiatric:unable to assess . Neurologic: no seizure no involuntary movements         Lab Data:   Basic Metabolic Panel: Recent Labs  Lab 02/20/19 0208 02/21/19 0632 02/23/19 0505 02/25/19 0550  NA 135 137 138 140  K 4.7 4.5 4.4 3.9  CL 106 107 106 105  CO2 19* 21* 20* 24  GLUCOSE 183* 216* 210* 213*  BUN 36* 38* 30* 38*  CREATININE 1.30* 1.27* 0.93 0.90  CALCIUM 8.6* 8.6* 9.1 8.9  MG 2.4 2.3 1.9 2.0  PHOS 3.4 3.6 4.4 4.3    ABG: No results for input(s): PHART, PCO2ART, PO2ART, HCO3, O2SAT in the last 168 hours.  Liver Function Tests: Recent Labs  Lab 02/20/19 0208 02/21/19 0632 02/23/19 0505 02/25/19 0550  ALBUMIN 2.1* 2.0* 2.0* 2.0*   No results for input(s): LIPASE, AMYLASE in the last 168 hours. No results for input(s): AMMONIA in the last 168 hours.  CBC: Recent Labs  Lab 02/20/19 0208 02/21/19 0632 02/23/19 0505 02/25/19 0550  WBC 8.3 7.2 5.9 4.5  HGB 9.9* 9.3* 9.6* 9.6*  HCT 31.7* 30.7* 31.2* 31.8*  MCV 88.8 91.4 88.9 88.6  PLT 353 300 252 248    Cardiac  Enzymes: No results for input(s): CKTOTAL, CKMB, CKMBINDEX, TROPONINI in the last 168 hours.  BNP (last 3 results) No results for input(s): BNP in the last 8760 hours.  ProBNP (last 3 results) No results for input(s): PROBNP in the last 8760 hours.  Radiological Exams: No results found.  Assessment/Plan Active Problems:   Acute on chronic respiratory failure with hypoxia (HCC)   Traumatic subarachnoid hemorrhage with loss of consciousness (HCC)   Lobar pneumonia, unspecified organism ([place])   Multiple closed fractures of cervical vertebrae (HCC)   Traumatic brain injury with loss of consciousness of one hour or more ([place])   1. Acute on chronic respiratory failure with hypoxia we will continue with the T collar will titrate oxygen continue pulmonary toilet 2. Traumatic subarachnoid hemorrhage unchanged we will continue with supportive care 3. Lobar pneumonia treated clinically improved 4. Multiple fractures status post Ortho repair 5. Traumatic brain injury grossly unchanged continue with present management   I have personally seen and evaluated the patient, evaluated laboratory and imaging results, formulated the assessment and plan and placed orders. The Patient requires high complexity decision making for assessment and support.  Case was discussed on Rounds with the Respiratory Therapy Staff  Allyne Gee, MD [facility] Pulmonary Critical Care Medicine Sleep Medicine

## 2019-02-26 ENCOUNTER — Other Ambulatory Visit (HOSPITAL_COMMUNITY): Payer: BC Managed Care – PPO

## 2019-02-26 DIAGNOSIS — J9621 Acute and chronic respiratory failure with hypoxia: Secondary | ICD-10-CM | POA: Diagnosis not present

## 2019-02-26 DIAGNOSIS — S129XXS Fracture of neck, unspecified, sequela: Secondary | ICD-10-CM | POA: Diagnosis not present

## 2019-02-26 DIAGNOSIS — J181 Lobar pneumonia, unspecified organism: Secondary | ICD-10-CM | POA: Diagnosis not present

## 2019-02-26 DIAGNOSIS — S069X9A Unspecified intracranial injury with loss of consciousness of unspecified duration, initial encounter: Secondary | ICD-10-CM | POA: Diagnosis not present

## 2019-02-26 NOTE — Progress Notes (Signed)
Pulmonary Critical Care Medicine [place]   PULMONARY CRITICAL CARE SERVICE  PROGRESS NOTE  Date of Service: 02/26/2019  Luke Clayton  EHM:[id number]  DOB: 07/12/1955   DOA: 02/17/2019  Referring Physician: Merton Border, MD  HPI: Luke Clayton is a 63 y.o. male seen for follow up of Acute on Chronic Respiratory Failure.  Patient currently is on T collar has a #8 trach in place cervical collar still remains in place  Medications: Reviewed on Rounds  Physical Exam:  Vitals: Temperature 97.8 pulse 88 respiratory rate 18 blood pressure 114/74 saturations 99%  Ventilator Settings patient is on trach collar FiO2 28%  . General: Comfortable at this time . Eyes: Grossly normal lids, irises & conjunctiva . ENT: grossly tongue is normal . Neck: no obvious mass . Cardiovascular: S1 S2 normal no gallop . Respiratory: No rhonchi coarse breath sounds . Abdomen: soft . Skin: no rash seen on limited exam . Musculoskeletal: not rigid . Psychiatric:unable to assess . Neurologic: no seizure no involuntary movements         Lab Data:   Basic Metabolic Panel: Recent Labs  Lab 02/20/19 0208 02/21/19 0632 02/23/19 0505 02/25/19 0550  NA 135 137 138 140  K 4.7 4.5 4.4 3.9  CL 106 107 106 105  CO2 19* 21* 20* 24  GLUCOSE 183* 216* 210* 213*  BUN 36* 38* 30* 38*  CREATININE 1.30* 1.27* 0.93 0.90  CALCIUM 8.6* 8.6* 9.1 8.9  MG 2.4 2.3 1.9 2.0  PHOS 3.4 3.6 4.4 4.3    ABG: No results for input(s): PHART, PCO2ART, PO2ART, HCO3, O2SAT in the last 168 hours.  Liver Function Tests: Recent Labs  Lab 02/20/19 0208 02/21/19 0632 02/23/19 0505 02/25/19 0550  ALBUMIN 2.1* 2.0* 2.0* 2.0*   No results for input(s): LIPASE, AMYLASE in the last 168 hours. No results for input(s): AMMONIA in the last 168 hours.  CBC: Recent Labs  Lab 02/20/19 0208 02/21/19 0632 02/23/19 0505 02/25/19 0550  WBC 8.3 7.2 5.9 4.5  HGB 9.9* 9.3* 9.6* 9.6*  HCT 31.7* 30.7* 31.2*  31.8*  MCV 88.8 91.4 88.9 88.6  PLT 353 300 252 248    Cardiac Enzymes: No results for input(s): CKTOTAL, CKMB, CKMBINDEX, TROPONINI in the last 168 hours.  BNP (last 3 results) No results for input(s): BNP in the last 8760 hours.  ProBNP (last 3 results) No results for input(s): PROBNP in the last 8760 hours.  Radiological Exams: No results found.  Assessment/Plan Active Problems:   Acute on chronic respiratory failure with hypoxia (HCC)   Traumatic subarachnoid hemorrhage with loss of consciousness (HCC)   Lobar pneumonia, unspecified organism ([place])   Multiple closed fractures of cervical vertebrae (HCC)   Traumatic brain injury with loss of consciousness of one hour or more ([place])   1. Acute on chronic respiratory failure with hypoxia we will continue with T collar as tolerated.  Patient needs to have the tracheostomy downsized however we need to make sure that the patient is cleared to have a cervical collar loosening so that the change can occur will discuss with the primary care team 2. Traumatic subarachnoid hemorrhage unchanged we will continue with supportive care 3. Lobar pneumonia treated improving 4. Multiple fractures as already mentioned above we will continue with supportive care 5. TBI grossly unchanged   I have personally seen and evaluated the patient, evaluated laboratory and imaging results, formulated the assessment and plan and placed orders. The Patient requires high complexity decision making for  assessment and support.  Case was discussed on Rounds with the Respiratory Therapy Staff  Allyne Gee, MD [facility] Pulmonary Critical Care Medicine Sleep Medicine

## 2019-02-27 ENCOUNTER — Other Ambulatory Visit (HOSPITAL_COMMUNITY): Payer: BC Managed Care – PPO

## 2019-02-27 DIAGNOSIS — J181 Lobar pneumonia, unspecified organism: Secondary | ICD-10-CM | POA: Diagnosis not present

## 2019-02-27 DIAGNOSIS — S129XXS Fracture of neck, unspecified, sequela: Secondary | ICD-10-CM | POA: Diagnosis not present

## 2019-02-27 DIAGNOSIS — S069X9A Unspecified intracranial injury with loss of consciousness of unspecified duration, initial encounter: Secondary | ICD-10-CM | POA: Diagnosis not present

## 2019-02-27 DIAGNOSIS — J9621 Acute and chronic respiratory failure with hypoxia: Secondary | ICD-10-CM | POA: Diagnosis not present

## 2019-02-27 LAB — CULTURE, RESPIRATORY W GRAM STAIN: Culture: NORMAL

## 2019-02-27 NOTE — Progress Notes (Signed)
[facility]   PULMONARY CRITICAL CARE SERVICE  PROGRESS NOTE  Date of Service: 02/27/2019  Lily KocherLouis Barre  ZOX:[id number]  DOB: 1956/02/08   DOA: 02/17/2019  Referring Physician: Carron CurieAli Hijazi, MD  HPI: Lily KocherLouis Micucci is a 63 y.o. male seen for follow up of Acute on Chronic Respiratory Failure.  Patient currently has on T collar on 28% FiO2 reviewed his chart note trach change at this time  Medications: Reviewed on Rounds  Physical Exam:  Vitals: Temperature 98.7 pulse 118 respiratory rate 16 blood pressure 128/72 saturations 97%  Ventilator Settings off the ventilator right now on T collar on 28% FiO2  . General: Comfortable at this time . Eyes: Grossly normal lids, irises & conjunctiva . ENT: grossly tongue is normal . Neck: no obvious mass . Cardiovascular: S1 S2 normal no gallop . Respiratory: No rhonchi no rales are noted . Abdomen: soft . Skin: no rash seen on limited exam . Musculoskeletal: not rigid . Psychiatric:unable to assess . Neurologic: no seizure no involuntary movements         Lab Data:   Basic Metabolic Panel: Recent Labs  Lab 02/21/19 0632 02/23/19 0505 02/25/19 0550  NA 137 138 140  K 4.5 4.4 3.9  CL 107 106 105  CO2 21* 20* 24  GLUCOSE 216* 210* 213*  BUN 38* 30* 38*  CREATININE 1.27* 0.93 0.90  CALCIUM 8.6* 9.1 8.9  MG 2.3 1.9 2.0  PHOS 3.6 4.4 4.3    ABG: No results for input(s): PHART, PCO2ART, PO2ART, HCO3, O2SAT in the last 168 hours.  Liver Function Tests: Recent Labs  Lab 02/21/19 0632 02/23/19 0505 02/25/19 0550  ALBUMIN 2.0* 2.0* 2.0*   No results for input(s): LIPASE, AMYLASE in the last 168 hours. No results for input(s): AMMONIA in the last 168 hours.  CBC: Recent Labs  Lab 02/21/19 0632 02/23/19 0505 02/25/19 0550  WBC 7.2 5.9 4.5  HGB 9.3* 9.6* 9.6*  HCT 30.7* 31.2* 31.8*  MCV 91.4 88.9 88.6  PLT 300 252 248    Cardiac Enzymes: No results for input(s):  CKTOTAL, CKMB, CKMBINDEX, TROPONINI in the last 168 hours.  BNP (last 3 results) No results for input(s): BNP in the last 8760 hours.  ProBNP (last 3 results) No results for input(s): PROBNP in the last 8760 hours.  Radiological Exams: Dg Abd Portable 1v  Result Date: 02/27/2019 CLINICAL DATA:  NG tube placement EXAM: PORTABLE ABDOMEN - 1 VIEW COMPARISON:  02/27/2019 FINDINGS: Esophageal tube tip overlies the distal stomach. Visible bowel gas pattern is nonobstructed. IMPRESSION: Enteric tube tip overlies the distal stomach Electronically Signed   By: Jasmine PangKim  Fujinaga M.D.   On: 02/27/2019 02:06   Dg Abd Portable 1v  Result Date: 02/27/2019 CLINICAL DATA:  NG tube placement EXAM: PORTABLE ABDOMEN - 1 VIEW COMPARISON:  02/17/2019 FINDINGS: Esophageal tube tip is just beyond the GE junction. Nonspecific gas-filled small bowel in the central abdomen. The heart appears enlarged. IMPRESSION: Enteric tube tip projects slightly beyond GE junction, consider further advancement for more optimal positioning. Electronically Signed   By: Jasmine PangKim  Fujinaga M.D.   On: 02/27/2019 00:26    Assessment/Plan Active Problems:   Acute on chronic respiratory failure with hypoxia (HCC)   Traumatic subarachnoid hemorrhage with loss of consciousness (HCC)   Lobar pneumonia, unspecified organism (HCC)   Multiple closed fractures of cervical vertebrae (HCC)   Traumatic brain injury with loss of consciousness of one hour or more (HCC)  1. Acute on chronic respiratory failure with hypoxia we will continue with T collar trials patient is on 28% at this time 2. Traumatic subarachnoid hemorrhage we will continue with supportive care. 3. Lobar pneumonia treated 4. Multiple closed fractures we will continue with supportive care 5. Traumatic brain injury grossly unchanged   I have personally seen and evaluated the patient, evaluated laboratory and imaging results, formulated the assessment and plan and placed orders. The  Patient requires high complexity decision making for assessment and support.  Case was discussed on Rounds with the Respiratory Therapy Staff  Allyne Gee, MD [facility] Pulmonary Critical Care Medicine Sleep Medicine

## 2019-02-28 ENCOUNTER — Other Ambulatory Visit (HOSPITAL_COMMUNITY): Payer: BC Managed Care – PPO

## 2019-02-28 ENCOUNTER — Encounter: Payer: Self-pay | Admitting: Physician Assistant

## 2019-02-28 DIAGNOSIS — J9621 Acute and chronic respiratory failure with hypoxia: Secondary | ICD-10-CM | POA: Diagnosis not present

## 2019-02-28 DIAGNOSIS — S129XXS Fracture of neck, unspecified, sequela: Secondary | ICD-10-CM | POA: Diagnosis not present

## 2019-02-28 DIAGNOSIS — S069X9A Unspecified intracranial injury with loss of consciousness of unspecified duration, initial encounter: Secondary | ICD-10-CM | POA: Diagnosis not present

## 2019-02-28 DIAGNOSIS — J181 Lobar pneumonia, unspecified organism: Secondary | ICD-10-CM | POA: Diagnosis not present

## 2019-02-28 LAB — CBC
HCT: 40.6 % (ref 39.0–52.0)
Hemoglobin: 12.4 g/dL — ABNORMAL LOW (ref 13.0–17.0)
MCH: 27.3 pg (ref 26.0–34.0)
MCHC: 30.5 g/dL (ref 30.0–36.0)
MCV: 89.4 fL (ref 80.0–100.0)
Platelets: 226 10*3/uL (ref 150–400)
RBC: 4.54 MIL/uL (ref 4.22–5.81)
RDW: 17 % — ABNORMAL HIGH (ref 11.5–15.5)
WBC: 7.8 10*3/uL (ref 4.0–10.5)
nRBC: 0 % (ref 0.0–0.2)

## 2019-02-28 LAB — BASIC METABOLIC PANEL
Anion gap: 10 (ref 5–15)
BUN: 48 mg/dL — ABNORMAL HIGH (ref 8–23)
CO2: 22 mmol/L (ref 22–32)
Calcium: 8.9 mg/dL (ref 8.9–10.3)
Chloride: 114 mmol/L — ABNORMAL HIGH (ref 98–111)
Creatinine, Ser: 1.44 mg/dL — ABNORMAL HIGH (ref 0.61–1.24)
GFR calc Af Amer: 59 mL/min — ABNORMAL LOW (ref >60)
GFR calc non Af Amer: 51 mL/min — ABNORMAL LOW (ref >60)
Glucose, Bld: 269 mg/dL — ABNORMAL HIGH (ref 70–99)
Potassium: 3.7 mmol/L (ref 3.5–5.1)
Sodium: 146 mmol/L — ABNORMAL HIGH (ref 135–145)

## 2019-02-28 LAB — C DIFFICILE QUICK SCREEN W PCR REFLEX
C Diff antigen: NEGATIVE
C Diff interpretation: NOT DETECTED
C Diff toxin: NEGATIVE

## 2019-02-28 LAB — SARS CORONAVIRUS 2 BY RT PCR (HOSPITAL ORDER, PERFORMED IN ~~LOC~~ HOSPITAL LAB): SARS Coronavirus 2: NEGATIVE

## 2019-02-28 LAB — MAGNESIUM: Magnesium: 2.2 mg/dL (ref 1.7–2.4)

## 2019-02-28 NOTE — Progress Notes (Addendum)
Pulmonary Critical Care Medicine [place]   PULMONARY CRITICAL CARE SERVICE  PROGRESS NOTE  Date of Service: 02/28/2019  Luke Clayton  [id number]:[id number]  DOB: 09/14/1955   DOA: 02/17/2019  Referring Physician: Merton Border, MD  HPI: Luke Clayton is a 63 y.o. male seen for follow up of Acute on Chronic Respiratory Failure.  Patient mains on aerosol trach collar 28% FiO2.  Patient is febrile 101.7 at this time.  Medications: Reviewed on Rounds  Physical Exam:  Vitals: Pulse 124 respirations 26 BP 131/85 O2 sat 95% temp 101.7  Ventilator Settings ATC 28%  . General: Comfortable at this time . Eyes: Grossly normal lids, irises & conjunctiva . ENT: grossly tongue is normal . Neck: no obvious mass . Cardiovascular: S1 S2 normal no gallop . Respiratory: No rales or rhonchi noted . Abdomen: soft . Skin: no rash seen on limited exam . Musculoskeletal: not rigid . Psychiatric:unable to assess . Neurologic: no seizure no involuntary movements         Lab Data:   Basic Metabolic Panel: Recent Labs  Lab 02/23/19 0505 02/25/19 0550 02/28/19 1427  NA 138 140 146*  K 4.4 3.9 3.7  CL 106 105 114*  CO2 20* 24 22  GLUCOSE 210* 213* 269*  BUN 30* 38* 48*  CREATININE 0.93 0.90 1.44*  CALCIUM 9.1 8.9 8.9  MG 1.9 2.0 2.2  PHOS 4.4 4.3  --     ABG: No results for input(s): PHART, PCO2ART, PO2ART, HCO3, O2SAT in the last 168 hours.  Liver Function Tests: Recent Labs  Lab 02/23/19 0505 02/25/19 0550  ALBUMIN 2.0* 2.0*   No results for input(s): LIPASE, AMYLASE in the last 168 hours. No results for input(s): AMMONIA in the last 168 hours.  CBC: Recent Labs  Lab 02/23/19 0505 02/25/19 0550 02/28/19 1427  WBC 5.9 4.5 7.8  HGB 9.6* 9.6* 12.4*  HCT 31.2* 31.8* 40.6  MCV 88.9 88.6 89.4  PLT 252 248 226    Cardiac Enzymes: No results for input(s): CKTOTAL, CKMB, CKMBINDEX, TROPONINI in the last 168 hours.  BNP (last 3 results) No results for  input(s): BNP in the last 8760 hours.  ProBNP (last 3 results) No results for input(s): PROBNP in the last 8760 hours.  Radiological Exams: Ct Abdomen Wo Contrast  Result Date: 02/28/2019 CLINICAL DATA:  Respiratory failure, preop planning for gastrostomy placement EXAM: CT ABDOMEN WITHOUT CONTRAST TECHNIQUE: Multidetector CT imaging of the abdomen was performed following the standard protocol without IV contrast. COMPARISON:  None. FINDINGS: Lower chest: Consolidation/atelectasis in the posterior left lower lobe. Aortic Atherosclerosis (ICD10-170.0). Hepatobiliary: No focal liver abnormality is seen. Status post cholecystectomy. No biliary dilatation. Pancreas: Unremarkable. No pancreatic ductal dilatation or surrounding inflammatory changes. Spleen: Normal in size without focal abnormality. Adrenals/Urinary Tract: Adrenal glands are unremarkable. Kidneys are normal, without renal calculi, focal lesion, or hydronephrosis. Stomach/Bowel: Feeding tube extends to the pylorus. The stomach is decompressed. There is a safe percutaneous window for gastrostomy placement. Visualized portions of small bowel and colon unremarkable. Vascular/Lymphatic: Ectatic atheromatous aorta. No abdominal or mesenteric adenopathy. Other: No ascites. No free air. Musculoskeletal: Spondylitic changes in the visualized lumbar spine. No fracture or worrisome bone lesion. IMPRESSION: 1. There is a safe percutaneous window for gastrostomy placement. 2. Consolidation/atelectasis in the posterior left lower lobe. 3. Ectatic abdominal aorta at risk for aneurysm development. Recommend followup by ultrasound in 5 years. This recommendation follows ACR consensus guidelines: White Paper of the ACR Incidental Findings Committee II on  Vascular Findings. J Am Coll Radiol 2013; 10:789-794. Electronically Signed   By: Corlis Leak  Hassell M.D.   On: 02/28/2019 07:38   Dg Chest Port 1 View  Result Date: 02/28/2019 CLINICAL DATA:  Pneumonia. EXAM: PORTABLE  CHEST 1 VIEW COMPARISON:  Ten days ago FINDINGS: Hazy left base opacification with volume loss which had an atelectatic appearance on abdominal CT yesterday. Normal heart size for technique. The right lung is clear. Tracheostomy tube in place. Feeding tube at least reaches the stomach. Overlapping hardware artifact. IMPRESSION: Retrocardiac opacity from atelectasis by CT yesterday. Otherwise the lungs are clear. Electronically Signed   By: Marnee SpringJonathon  Watts M.D.   On: 02/28/2019 06:59   Dg Abd Portable 1v  Result Date: 02/27/2019 CLINICAL DATA:  NG tube placement EXAM: PORTABLE ABDOMEN - 1 VIEW COMPARISON:  02/27/2019 FINDINGS: Esophageal tube tip overlies the distal stomach. Visible bowel gas pattern is nonobstructed. IMPRESSION: Enteric tube tip overlies the distal stomach Electronically Signed   By: Jasmine PangKim  Fujinaga M.D.   On: 02/27/2019 02:06   Dg Abd Portable 1v  Result Date: 02/27/2019 CLINICAL DATA:  NG tube placement EXAM: PORTABLE ABDOMEN - 1 VIEW COMPARISON:  02/17/2019 FINDINGS: Esophageal tube tip is just beyond the GE junction. Nonspecific gas-filled small bowel in the central abdomen. The heart appears enlarged. IMPRESSION: Enteric tube tip projects slightly beyond GE junction, consider further advancement for more optimal positioning. Electronically Signed   By: Jasmine PangKim  Fujinaga M.D.   On: 02/27/2019 00:26    Assessment/Plan Active Problems:   Acute on chronic respiratory failure with hypoxia (HCC)   Traumatic subarachnoid hemorrhage with loss of consciousness (HCC)   Lobar pneumonia, unspecified organism (HCC)   Multiple closed fractures of cervical vertebrae (HCC)   Traumatic brain injury with loss of consciousness of one hour or more (HCC)   1. Acute on chronic respiratory failure with hypoxia we will continue with T collar trials patient is on 28% at this time 2. Traumatic subarachnoid hemorrhage we will continue with supportive care. 3. Lobar pneumonia treated 4. Multiple closed  fractures we will continue with supportive care 5. Traumatic brain injury grossly unchanged   I have personally seen and evaluated the patient, evaluated laboratory and imaging results, formulated the assessment and plan and placed orders. The Patient requires high complexity decision making for assessment and support.  Case was discussed on Rounds with the Respiratory Therapy Staff  Yevonne PaxSaadat A Yanni Ruberg, MD [facility] Pulmonary Critical Care Medicine Sleep Medicine
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
## 2019-03-01 DIAGNOSIS — J9621 Acute and chronic respiratory failure with hypoxia: Secondary | ICD-10-CM | POA: Diagnosis not present

## 2019-03-01 DIAGNOSIS — J181 Lobar pneumonia, unspecified organism: Secondary | ICD-10-CM | POA: Diagnosis not present

## 2019-03-01 DIAGNOSIS — S069X9A Unspecified intracranial injury with loss of consciousness of unspecified duration, initial encounter: Secondary | ICD-10-CM | POA: Diagnosis not present

## 2019-03-01 DIAGNOSIS — S129XXS Fracture of neck, unspecified, sequela: Secondary | ICD-10-CM | POA: Diagnosis not present

## 2019-03-01 LAB — BASIC METABOLIC PANEL
Anion gap: 11 (ref 5–15)
BUN: 50 mg/dL — ABNORMAL HIGH (ref 8–23)
CO2: 22 mmol/L (ref 22–32)
Calcium: 8.7 mg/dL — ABNORMAL LOW (ref 8.9–10.3)
Chloride: 114 mmol/L — ABNORMAL HIGH (ref 98–111)
Creatinine, Ser: 1.4 mg/dL — ABNORMAL HIGH (ref 0.61–1.24)
GFR calc Af Amer: >60 mL/min (ref >60)
GFR calc non Af Amer: 53 mL/min — ABNORMAL LOW (ref >60)
Glucose, Bld: 209 mg/dL — ABNORMAL HIGH (ref 70–99)
Potassium: 3.5 mmol/L (ref 3.5–5.1)
Sodium: 147 mmol/L — ABNORMAL HIGH (ref 135–145)

## 2019-03-01 LAB — CBC
HCT: 39.3 % (ref 39.0–52.0)
Hemoglobin: 11.7 g/dL — ABNORMAL LOW (ref 13.0–17.0)
MCH: 26.9 pg (ref 26.0–34.0)
MCHC: 29.8 g/dL — ABNORMAL LOW (ref 30.0–36.0)
MCV: 90.3 fL (ref 80.0–100.0)
Platelets: 178 10*3/uL (ref 150–400)
RBC: 4.35 MIL/uL (ref 4.22–5.81)
RDW: 17.3 % — ABNORMAL HIGH (ref 11.5–15.5)
WBC: 10.5 10*3/uL (ref 4.0–10.5)
nRBC: 0 % (ref 0.0–0.2)

## 2019-03-01 LAB — CULTURE, BLOOD (ROUTINE X 2)
Culture: NO GROWTH
Culture: NO GROWTH
Special Requests: ADEQUATE
Special Requests: ADEQUATE

## 2019-03-01 LAB — PROTIME-INR
INR: 1.2 (ref 0.8–1.2)
Prothrombin Time: 15.4 seconds — ABNORMAL HIGH (ref 11.4–15.2)

## 2019-03-01 NOTE — Progress Notes (Signed)
[facility]   PULMONARY CRITICAL CARE SERVICE  PROGRESS NOTE  Date of Service: 03/01/2019  Luke KocherLouis Clayton  [id number]:[id number]  DOB: 11-17-1955   DOA: 02/17/2019  Referring Physician: Carron CurieAli Hijazi, MD  HPI: Luke Clayton is a 63 y.o. male seen for follow up of Acute on Chronic Respiratory Failure.  Patient is currently on T collar did spike a fever also yesterday  Medications: Reviewed on Rounds  Physical Exam:  Vitals: Temperature 101.0 pulse 122 respiratory rate 19 blood pressure 121/68 saturations 94%  Ventilator Settings off the ventilator right now on T collar  . General: Comfortable at this time . Eyes: Grossly normal lids, irises & conjunctiva . ENT: grossly tongue is normal . Neck: no obvious mass . Cardiovascular: S1 S2 normal no gallop . Respiratory: No rhonchi no rales are noted . Abdomen: soft . Skin: no rash seen on limited exam . Musculoskeletal: not rigid . Psychiatric:unable to assess . Neurologic: no seizure no involuntary movements         Lab Data:   Basic Metabolic Panel: Recent Labs  Lab 02/23/19 0505 02/25/19 0550 02/28/19 1427 03/01/19 0723  NA 138 140 146* 147*  K 4.4 3.9 3.7 3.5  CL 106 105 114* 114*  CO2 20* 24 22 22   GLUCOSE 210* 213* 269* 209*  BUN 30* 38* 48* 50*  CREATININE 0.93 0.90 1.44* 1.40*  CALCIUM 9.1 8.9 8.9 8.7*  MG 1.9 2.0 2.2  --   PHOS 4.4 4.3  --   --     ABG: No results for input(s): PHART, PCO2ART, PO2ART, HCO3, O2SAT in the last 168 hours.  Liver Function Tests: Recent Labs  Lab 02/23/19 0505 02/25/19 0550  ALBUMIN 2.0* 2.0*   No results for input(s): LIPASE, AMYLASE in the last 168 hours. No results for input(s): AMMONIA in the last 168 hours.  CBC: Recent Labs  Lab 02/23/19 0505 02/25/19 0550 02/28/19 1427 03/01/19 0723  WBC 5.9 4.5 7.8 10.5  HGB 9.6* 9.6* 12.4* 11.7*  HCT 31.2* 31.8* 40.6 39.3  MCV 88.9 88.6 89.4 90.3  PLT 252 248 226 178     Cardiac Enzymes: No results for input(s): CKTOTAL, CKMB, CKMBINDEX, TROPONINI in the last 168 hours.  BNP (last 3 results) No results for input(s): BNP in the last 8760 hours.  ProBNP (last 3 results) No results for input(s): PROBNP in the last 8760 hours.  Radiological Exams: Ct Abdomen Wo Contrast  Result Date: 02/28/2019 CLINICAL DATA:  Respiratory failure, preop planning for gastrostomy placement EXAM: CT ABDOMEN WITHOUT CONTRAST TECHNIQUE: Multidetector CT imaging of the abdomen was performed following the standard protocol without IV contrast. COMPARISON:  None. FINDINGS: Lower chest: Consolidation/atelectasis in the posterior left lower lobe. Aortic Atherosclerosis (ICD10-170.0). Hepatobiliary: No focal liver abnormality is seen. Status post cholecystectomy. No biliary dilatation. Pancreas: Unremarkable. No pancreatic ductal dilatation or surrounding inflammatory changes. Spleen: Normal in size without focal abnormality. Adrenals/Urinary Tract: Adrenal glands are unremarkable. Kidneys are normal, without renal calculi, focal lesion, or hydronephrosis. Stomach/Bowel: Feeding tube extends to the pylorus. The stomach is decompressed. There is a safe percutaneous window for gastrostomy placement. Visualized portions of small bowel and colon unremarkable. Vascular/Lymphatic: Ectatic atheromatous aorta. No abdominal or mesenteric adenopathy. Other: No ascites. No free air. Musculoskeletal: Spondylitic changes in the visualized lumbar spine. No fracture or worrisome bone lesion. IMPRESSION: 1. There is a safe percutaneous window for gastrostomy placement. 2. Consolidation/atelectasis in the posterior left lower lobe. 3. Ectatic abdominal aorta at  risk for aneurysm development. Recommend followup by ultrasound in 5 years. This recommendation follows ACR consensus guidelines: White Paper of the ACR Incidental Findings Committee II on Vascular Findings. J Am Coll Radiol 2013; 10:789-794.  Electronically Signed   By: Lucrezia Europe M.D.   On: 02/28/2019 07:38   Dg Chest Port 1 View  Result Date: 02/28/2019 CLINICAL DATA:  Pneumonia. EXAM: PORTABLE CHEST 1 VIEW COMPARISON:  Ten days ago FINDINGS: Hazy left base opacification with volume loss which had an atelectatic appearance on abdominal CT yesterday. Normal heart size for technique. The right lung is clear. Tracheostomy tube in place. Feeding tube at least reaches the stomach. Overlapping hardware artifact. IMPRESSION: Retrocardiac opacity from atelectasis by CT yesterday. Otherwise the lungs are clear. Electronically Signed   By: Monte Fantasia M.D.   On: 02/28/2019 06:59    Assessment/Plan Active Problems:   Acute on chronic respiratory failure with hypoxia (HCC)   Traumatic subarachnoid hemorrhage with loss of consciousness (HCC)   Lobar pneumonia, unspecified organism ([place])   Multiple closed fractures of cervical vertebrae (HCC)   Traumatic brain injury with loss of consciousness of one hour or more ([place])   1. Acute on chronic respiratory failure with hypoxia we will continue with T collar trials patient currently is on 40% FiO2 2. Subarachnoid hemorrhage unchanged we will continue to follow 3. Lobar pneumonia unspecified treated we will continue monitoring radiologically. 4. Close cervical fractures patient is at baseline will need neurosurgical follow-up 5. Traumatic brain injury grossly unchanged   I have personally seen and evaluated the patient, evaluated laboratory and imaging results, formulated the assessment and plan and placed orders. The Patient requires high complexity decision making for assessment and support.  Case was discussed on Rounds with the Respiratory Therapy Staff  Allyne Gee, MD [facility] Pulmonary Critical Care Medicine Sleep Medicine

## 2019-03-02 DIAGNOSIS — S069X9A Unspecified intracranial injury with loss of consciousness of unspecified duration, initial encounter: Secondary | ICD-10-CM | POA: Diagnosis not present

## 2019-03-02 DIAGNOSIS — J9621 Acute and chronic respiratory failure with hypoxia: Secondary | ICD-10-CM | POA: Diagnosis not present

## 2019-03-02 DIAGNOSIS — S129XXS Fracture of neck, unspecified, sequela: Secondary | ICD-10-CM | POA: Diagnosis not present

## 2019-03-02 DIAGNOSIS — J181 Lobar pneumonia, unspecified organism: Secondary | ICD-10-CM | POA: Diagnosis not present

## 2019-03-02 LAB — URINALYSIS, ROUTINE W REFLEX MICROSCOPIC
Bilirubin Urine: NEGATIVE
Glucose, UA: NEGATIVE mg/dL
Ketones, ur: NEGATIVE mg/dL
Leukocytes,Ua: NEGATIVE
Nitrite: NEGATIVE
Protein, ur: 30 mg/dL — AB
Specific Gravity, Urine: 1.021 (ref 1.005–1.030)
pH: 5 (ref 5.0–8.0)

## 2019-03-02 LAB — BASIC METABOLIC PANEL
Anion gap: 9 (ref 5–15)
BUN: 58 mg/dL — ABNORMAL HIGH (ref 8–23)
CO2: 23 mmol/L (ref 22–32)
Calcium: 8.2 mg/dL — ABNORMAL LOW (ref 8.9–10.3)
Chloride: 113 mmol/L — ABNORMAL HIGH (ref 98–111)
Creatinine, Ser: 1.47 mg/dL — ABNORMAL HIGH (ref 0.61–1.24)
GFR calc Af Amer: 58 mL/min — ABNORMAL LOW (ref >60)
GFR calc non Af Amer: 50 mL/min — ABNORMAL LOW (ref >60)
Glucose, Bld: 161 mg/dL — ABNORMAL HIGH (ref 70–99)
Potassium: 3.7 mmol/L (ref 3.5–5.1)
Sodium: 145 mmol/L (ref 135–145)

## 2019-03-02 LAB — LACTIC ACID, PLASMA: Lactic Acid, Venous: 1.3 mmol/L (ref 0.5–1.9)

## 2019-03-02 NOTE — Progress Notes (Addendum)
Pulmonary Critical Care Medicine [place]   PULMONARY CRITICAL CARE SERVICE  PROGRESS NOTE  Date of Service: 03/02/2019  Luke Clayton  [id number]  DOB: [birth year]   DOA: 02/17/2019  Referring Physician: Merton Border, MD  HPI: Luke Clayton is a 63 y.o. male seen for follow up of Acute on Chronic Respiratory Failure.  Patient remains on aerosol trach collar 40% FiO2 satting well no distress.  Medications: Reviewed on Rounds  Physical Exam:  Vitals: Pulse 110 respirations 30 BP 110/60 O2 sat 94% temp 100.7  Ventilator Settings ATC 40%  . General: Comfortable at this time . Eyes: Grossly normal lids, irises & conjunctiva . ENT: grossly tongue is normal . Neck: no obvious mass . Cardiovascular: S1 S2 normal no gallop . Respiratory: No rales or rhonchi noted . Abdomen: soft . Skin: no rash seen on limited exam . Musculoskeletal: not rigid . Psychiatric:unable to assess . Neurologic: no seizure no involuntary movements         Lab Data:   Basic Metabolic Panel: Recent Labs  Lab 02/25/19 0550 02/28/19 1427 03/01/19 0723 03/02/19 0627  NA 140 146* 147* 145  K 3.9 3.7 3.5 3.7  CL 105 114* 114* 113*  CO2 24 22 22 23   GLUCOSE 213* 269* 209* 161*  BUN 38* 48* 50* 58*  CREATININE 0.90 1.44* 1.40* 1.47*  CALCIUM 8.9 8.9 8.7* 8.2*  MG 2.0 2.2  --   --   PHOS 4.3  --   --   --     ABG: No results for input(s): PHART, PCO2ART, PO2ART, HCO3, O2SAT in the last 168 hours.  Liver Function Tests: Recent Labs  Lab 02/25/19 0550  ALBUMIN 2.0*   No results for input(s): LIPASE, AMYLASE in the last 168 hours. No results for input(s): AMMONIA in the last 168 hours.  CBC: Recent Labs  Lab 02/25/19 0550 02/28/19 1427 03/01/19 0723  WBC 4.5 7.8 10.5  HGB 9.6* 12.4* 11.7*  HCT 31.8* 40.6 39.3  MCV 88.6 89.4 90.3  PLT 248 226 178    Cardiac Enzymes: No results for input(s): CKTOTAL, CKMB, CKMBINDEX, TROPONINI in the last 168 hours.  BNP  (last 3 results) No results for input(s): BNP in the last 8760 hours.  ProBNP (last 3 results) No results for input(s): PROBNP in the last 8760 hours.  Radiological Exams: No results found.  Assessment/Plan Active Problems:   Acute on chronic respiratory failure with hypoxia (HCC)   Traumatic subarachnoid hemorrhage with loss of consciousness (HCC)   Lobar pneumonia, unspecified organism ([place])   Multiple closed fractures of cervical vertebrae (HCC)   Traumatic brain injury with loss of consciousness of one hour or more ([place])   1. Acute on chronic respiratory failure with hypoxia we will continue with T collar trials patient currently is on 40% FiO2 2. Subarachnoid hemorrhage unchanged we will continue to follow 3. Lobar pneumonia unspecified treated we will continue monitoring radiologically. 4. Close cervical fractures patient is at baseline will need neurosurgical follow-up 5. Traumatic brain injury grossly unchanged   I have personally seen and evaluated the patient, evaluated laboratory and imaging results, formulated the assessment and plan and placed orders. The Patient requires high complexity decision making for assessment and support.  Case was discussed on Rounds with the Respiratory Therapy Staff  Allyne Gee, MD [facility] Pulmonary Critical Care Medicine Sleep Medicine

## 2019-03-03 DIAGNOSIS — J181 Lobar pneumonia, unspecified organism: Secondary | ICD-10-CM | POA: Diagnosis not present

## 2019-03-03 DIAGNOSIS — J9621 Acute and chronic respiratory failure with hypoxia: Secondary | ICD-10-CM | POA: Diagnosis not present

## 2019-03-03 DIAGNOSIS — S069X9A Unspecified intracranial injury with loss of consciousness of unspecified duration, initial encounter: Secondary | ICD-10-CM | POA: Diagnosis not present

## 2019-03-03 DIAGNOSIS — S129XXS Fracture of neck, unspecified, sequela: Secondary | ICD-10-CM | POA: Diagnosis not present

## 2019-03-03 LAB — RENAL FUNCTION PANEL
Albumin: 2 g/dL — ABNORMAL LOW (ref 3.5–5.0)
Anion gap: 9 (ref 5–15)
BUN: 38 mg/dL — ABNORMAL HIGH (ref 8–23)
CO2: 25 mmol/L (ref 22–32)
Calcium: 8.6 mg/dL — ABNORMAL LOW (ref 8.9–10.3)
Chloride: 112 mmol/L — ABNORMAL HIGH (ref 98–111)
Creatinine, Ser: 1.07 mg/dL (ref 0.61–1.24)
GFR calc Af Amer: >60 mL/min (ref >60)
GFR calc non Af Amer: >60 mL/min (ref >60)
Glucose, Bld: 241 mg/dL — ABNORMAL HIGH (ref 70–99)
Phosphorus: 2.6 mg/dL (ref 2.5–4.6)
Potassium: 3.5 mmol/L (ref 3.5–5.1)
Sodium: 146 mmol/L — ABNORMAL HIGH (ref 135–145)

## 2019-03-03 LAB — CBC
HCT: 28 % — ABNORMAL LOW (ref 39.0–52.0)
Hemoglobin: 8.3 g/dL — ABNORMAL LOW (ref 13.0–17.0)
MCH: 27.3 pg (ref 26.0–34.0)
MCHC: 29.6 g/dL — ABNORMAL LOW (ref 30.0–36.0)
MCV: 92.1 fL (ref 80.0–100.0)
Platelets: 108 10*3/uL — ABNORMAL LOW (ref 150–400)
RBC: 3.04 MIL/uL — ABNORMAL LOW (ref 4.22–5.81)
RDW: 17.3 % — ABNORMAL HIGH (ref 11.5–15.5)
WBC: 3.7 10*3/uL — ABNORMAL LOW (ref 4.0–10.5)
nRBC: 0 % (ref 0.0–0.2)

## 2019-03-03 LAB — MAGNESIUM: Magnesium: 2.1 mg/dL (ref 1.7–2.4)

## 2019-03-03 LAB — URINE CULTURE: Culture: NO GROWTH

## 2019-03-03 NOTE — Progress Notes (Addendum)
Pulmonary Critical Care Medicine [place]   PULMONARY CRITICAL CARE SERVICE  PROGRESS NOTE  Date of Service: 03/03/2019  Luke Clayton  OXB:[id number]  DOB: July 17, 1955   DOA: 02/17/2019  Referring Physician: Merton Border, MD  HPI: Luke Clayton is a 63 y.o. male seen for follow up of Acute on Chronic Respiratory Failure.  Patient remains on T-bar 35% FiO2 satting well with no fever or distress at the time.  Medications: Reviewed on Rounds  Physical Exam:  Vitals: Pulse 100 respirations 26 BP 104/63 O2 sat 90% temp 98.0  Ventilator Settings T-bar 35%  . General: Comfortable at this time . Eyes: Grossly normal lids, irises & conjunctiva . ENT: grossly tongue is normal . Neck: no obvious mass . Cardiovascular: S1 S2 normal no gallop . Respiratory: No rales or rhonchi noted . Abdomen: soft . Skin: no rash seen on limited exam . Musculoskeletal: not rigid . Psychiatric:unable to assess . Neurologic: no seizure no involuntary movements         Lab Data:   Basic Metabolic Panel: Recent Labs  Lab 02/25/19 0550 02/28/19 1427 03/01/19 0723 03/02/19 0627 03/03/19 1051  NA 140 146* 147* 145 146*  K 3.9 3.7 3.5 3.7 3.5  CL 105 114* 114* 113* 112*  CO2 24 22 22 23 25   GLUCOSE 213* 269* 209* 161* 241*  BUN 38* 48* 50* 58* 38*  CREATININE 0.90 1.44* 1.40* 1.47* 1.07  CALCIUM 8.9 8.9 8.7* 8.2* 8.6*  MG 2.0 2.2  --   --  2.1  PHOS 4.3  --   --   --  2.6    ABG: No results for input(s): PHART, PCO2ART, PO2ART, HCO3, O2SAT in the last 168 hours.  Liver Function Tests: Recent Labs  Lab 02/25/19 0550 03/03/19 1051  ALBUMIN 2.0* 2.0*   No results for input(s): LIPASE, AMYLASE in the last 168 hours. No results for input(s): AMMONIA in the last 168 hours.  CBC: Recent Labs  Lab 02/25/19 0550 02/28/19 1427 03/01/19 0723 03/03/19 1138  WBC 4.5 7.8 10.5 3.7*  HGB 9.6* 12.4* 11.7* 8.3*  HCT 31.8* 40.6 39.3 28.0*  MCV 88.6 89.4 90.3 92.1  PLT 248  226 178 108*    Cardiac Enzymes: No results for input(s): CKTOTAL, CKMB, CKMBINDEX, TROPONINI in the last 168 hours.  BNP (last 3 results) No results for input(s): BNP in the last 8760 hours.  ProBNP (last 3 results) No results for input(s): PROBNP in the last 8760 hours.  Radiological Exams: No results found.  Assessment/Plan Active Problems:   Acute on chronic respiratory failure with hypoxia (HCC)   Traumatic subarachnoid hemorrhage with loss of consciousness (HCC)   Lobar pneumonia, unspecified organism ([place])   Multiple closed fractures of cervical vertebrae (HCC)   Traumatic brain injury with loss of consciousness of one hour or more ([place])   1. Acute on chronic respiratory failure with hypoxia we will continue with T collar trials patient currently is on 40% FiO2 2. Subarachnoid hemorrhage unchanged we will continue to follow 3. Lobar pneumonia unspecified treated we will continue monitoring radiologically. 4. Close cervical fractures patient is at baseline will need neurosurgical follow-up 5. Traumatic brain injury grossly unchanged   I have personally seen and evaluated the patient, evaluated laboratory and imaging results, formulated the assessment and plan and placed orders. The Patient requires high complexity decision making for assessment and support.  Case was discussed on Rounds with the Respiratory Therapy Staff  Allyne Gee, MD [facility] Pulmonary  Critical Care Medicine Sleep Medicine

## 2019-03-04 DIAGNOSIS — J9621 Acute and chronic respiratory failure with hypoxia: Secondary | ICD-10-CM | POA: Diagnosis not present

## 2019-03-04 DIAGNOSIS — J181 Lobar pneumonia, unspecified organism: Secondary | ICD-10-CM | POA: Diagnosis not present

## 2019-03-04 DIAGNOSIS — S069X9A Unspecified intracranial injury with loss of consciousness of unspecified duration, initial encounter: Secondary | ICD-10-CM | POA: Diagnosis not present

## 2019-03-04 DIAGNOSIS — S129XXS Fracture of neck, unspecified, sequela: Secondary | ICD-10-CM | POA: Diagnosis not present

## 2019-03-04 LAB — CULTURE, BLOOD (ROUTINE X 2): Special Requests: ADEQUATE

## 2019-03-04 NOTE — Progress Notes (Addendum)
Pulmonary Critical Care Medicine [place]   PULMONARY CRITICAL CARE SERVICE  PROGRESS NOTE  Date of Service: 03/04/2019  Luke Clayton  NLZ:[id number]  DOB: February 21, 1956   DOA: 02/17/2019  Referring Physician: Merton Border, MD  HPI: Luke Clayton is a 63 y.o. male seen for follow up of Acute on Chronic Respiratory Failure.  Patient mental T bar 30% FiO2 satting well no fever distress.  Medications: Reviewed on Rounds  Physical Exam:  Vitals: Pulse 78 respirations 25 BP 96/60 1% 100% temp 97.5  Ventilator Settings T-bar 30%  . General: Comfortable at this time . Eyes: Grossly normal lids, irises & conjunctiva . ENT: grossly tongue is normal . Neck: no obvious mass . Cardiovascular: S1 S2 normal no gallop . Respiratory: No rales or rhonchi noted . Abdomen: soft . Skin: no rash seen on limited exam . Musculoskeletal: not rigid . Psychiatric:unable to assess . Neurologic: no seizure no involuntary movements         Lab Data:   Basic Metabolic Panel: Recent Labs  Lab 02/28/19 1427 03/01/19 0723 03/02/19 0627 03/03/19 1051  NA 146* 147* 145 146*  K 3.7 3.5 3.7 3.5  CL 114* 114* 113* 112*  CO2 22 22 23 25   GLUCOSE 269* 209* 161* 241*  BUN 48* 50* 58* 38*  CREATININE 1.44* 1.40* 1.47* 1.07  CALCIUM 8.9 8.7* 8.2* 8.6*  MG 2.2  --   --  2.1  PHOS  --   --   --  2.6    ABG: No results for input(s): PHART, PCO2ART, PO2ART, HCO3, O2SAT in the last 168 hours.  Liver Function Tests: Recent Labs  Lab 03/03/19 1051  ALBUMIN 2.0*   No results for input(s): LIPASE, AMYLASE in the last 168 hours. No results for input(s): AMMONIA in the last 168 hours.  CBC: Recent Labs  Lab 02/28/19 1427 03/01/19 0723 03/03/19 1138  WBC 7.8 10.5 3.7*  HGB 12.4* 11.7* 8.3*  HCT 40.6 39.3 28.0*  MCV 89.4 90.3 92.1  PLT 226 178 108*    Cardiac Enzymes: No results for input(s): CKTOTAL, CKMB, CKMBINDEX, TROPONINI in the last 168 hours.  BNP (last 3  results) No results for input(s): BNP in the last 8760 hours.  ProBNP (last 3 results) No results for input(s): PROBNP in the last 8760 hours.  Radiological Exams: No results found.  Assessment/Plan Active Problems:   Acute on chronic respiratory failure with hypoxia (HCC)   Traumatic subarachnoid hemorrhage with loss of consciousness (HCC)   Lobar pneumonia, unspecified organism ([place])   Multiple closed fractures of cervical vertebrae (HCC)   Traumatic brain injury with loss of consciousness of one hour or more ([place])   1. Acute on chronic respiratory failure with hypoxia we will continue with T collar trials patient currently is on 35% FiO2 continue support measures. 2. Subarachnoid hemorrhage unchanged we will continue to follow 3. Lobar pneumonia unspecified treated we will continue monitoring radiologically. 4. Close cervical fractures patient is at baseline will need neurosurgical follow-up 5. Traumatic brain injury grossly unchanged   I have personally seen and evaluated the patient, evaluated laboratory and imaging results, formulated the assessment and plan and placed orders. The Patient requires high complexity decision making for assessment and support.  Case was discussed on Rounds with the Respiratory Therapy Staff  Allyne Gee, MD [facility] Pulmonary Critical Care Medicine Sleep Medicine

## 2019-03-05 ENCOUNTER — Encounter (HOSPITAL_COMMUNITY): Payer: Self-pay | Admitting: Diagnostic Radiology

## 2019-03-05 ENCOUNTER — Other Ambulatory Visit (HOSPITAL_COMMUNITY): Payer: BC Managed Care – PPO

## 2019-03-05 DIAGNOSIS — J9621 Acute and chronic respiratory failure with hypoxia: Secondary | ICD-10-CM | POA: Diagnosis not present

## 2019-03-05 DIAGNOSIS — S069X9A Unspecified intracranial injury with loss of consciousness of unspecified duration, initial encounter: Secondary | ICD-10-CM | POA: Diagnosis not present

## 2019-03-05 DIAGNOSIS — J181 Lobar pneumonia, unspecified organism: Secondary | ICD-10-CM | POA: Diagnosis not present

## 2019-03-05 DIAGNOSIS — S129XXS Fracture of neck, unspecified, sequela: Secondary | ICD-10-CM | POA: Diagnosis not present

## 2019-03-05 HISTORY — PX: IR GASTROSTOMY TUBE MOD SED: IMG625

## 2019-03-05 LAB — BLOOD CULTURE ID PANEL (REFLEXED)

## 2019-03-05 LAB — CBC
HCT: 29.2 % — ABNORMAL LOW (ref 39.0–52.0)
Hemoglobin: 8.7 g/dL — ABNORMAL LOW (ref 13.0–17.0)
MCH: 27 pg (ref 26.0–34.0)
MCHC: 29.8 g/dL — ABNORMAL LOW (ref 30.0–36.0)
MCV: 90.7 fL (ref 80.0–100.0)
Platelets: 110 10*3/uL — ABNORMAL LOW (ref 150–400)
RBC: 3.22 MIL/uL — ABNORMAL LOW (ref 4.22–5.81)
RDW: 17.5 % — ABNORMAL HIGH (ref 11.5–15.5)
WBC: 4 10*3/uL (ref 4.0–10.5)
nRBC: 0 % (ref 0.0–0.2)

## 2019-03-05 LAB — BASIC METABOLIC PANEL
Anion gap: 7 (ref 5–15)
BUN: 20 mg/dL (ref 8–23)
CO2: 26 mmol/L (ref 22–32)
Calcium: 8.3 mg/dL — ABNORMAL LOW (ref 8.9–10.3)
Chloride: 108 mmol/L (ref 98–111)
Creatinine, Ser: 0.88 mg/dL (ref 0.61–1.24)
GFR calc Af Amer: >60 mL/min (ref >60)
GFR calc non Af Amer: >60 mL/min (ref >60)
Glucose, Bld: 165 mg/dL — ABNORMAL HIGH (ref 70–99)
Potassium: 3.6 mmol/L (ref 3.5–5.1)
Sodium: 141 mmol/L (ref 135–145)

## 2019-03-05 LAB — CULTURE, RESPIRATORY W GRAM STAIN

## 2019-03-05 LAB — MAGNESIUM: Magnesium: 1.9 mg/dL (ref 1.7–2.4)

## 2019-03-05 MED ORDER — FENTANYL CITRATE (PF) 100 MCG/2ML IJ SOLN
INTRAMUSCULAR | Status: AC
  Filled 2019-03-05: qty 2

## 2019-03-05 MED ORDER — LIDOCAINE HCL 1 % IJ SOLN
INTRAMUSCULAR | Status: AC | PRN
  Administered 2019-03-05: 10 mL

## 2019-03-05 MED ORDER — LIDOCAINE HCL 1 % IJ SOLN
INTRAMUSCULAR | Status: AC
  Filled 2019-03-05: qty 20

## 2019-03-05 MED ORDER — FENTANYL CITRATE (PF) 100 MCG/2ML IJ SOLN
INTRAMUSCULAR | Status: AC | PRN
  Administered 2019-03-05: 25 ug via INTRAVENOUS

## 2019-03-05 MED ORDER — MIDAZOLAM HCL 2 MG/2ML IJ SOLN
INTRAMUSCULAR | Status: AC
  Filled 2019-03-05: qty 2

## 2019-03-05 MED ORDER — GLUCAGON HCL (RDNA) 1 MG IJ SOLR
INTRAMUSCULAR | Status: AC | PRN
  Administered 2019-03-05: 1 mg via INTRAVENOUS

## 2019-03-05 MED ORDER — GLUCAGON HCL RDNA (DIAGNOSTIC) 1 MG IJ SOLR
INTRAMUSCULAR | Status: AC
  Filled 2019-03-05: qty 1

## 2019-03-05 MED ORDER — CEFAZOLIN SODIUM-DEXTROSE 2-4 GM/100ML-% IV SOLN
INTRAVENOUS | Status: AC
  Administered 2019-03-05: 2 g
  Filled 2019-03-05: qty 100

## 2019-03-05 MED ORDER — IOHEXOL 300 MG/ML  SOLN
50.0000 mL | Freq: Once | INTRAMUSCULAR | Status: AC | PRN
  Administered 2019-03-05: 15:00:00 5 mL

## 2019-03-05 MED ORDER — MIDAZOLAM HCL 2 MG/2ML IJ SOLN
INTRAMUSCULAR | Status: AC | PRN
  Administered 2019-03-05: 0.5 mg via INTRAVENOUS

## 2019-03-05 NOTE — Procedures (Signed)
Interventional Radiology Procedure:   Indications: Traumatic brain injury and needs enteral nutrition.  Procedure: Gastrostomy tube placement  Findings: 20 Fr tube in stomach  Complications: None     EBL: Less than 20 ml  Plan: Keep NPO today and will evaluate G-tube tomorrow prior to starting feeds.     Bricelyn Freestone R. Anselm Pancoast, MD  Pager: [phone]

## 2019-03-05 NOTE — Progress Notes (Addendum)
[facility]   PULMONARY CRITICAL CARE SERVICE  PROGRESS NOTE  Date of Service: 03/05/2019  Luke Clayton  [id number]  DOB: Apr 13, 1956   DOA: 02/17/2019  Referring Physician: Carron CurieAli Hijazi, MD  HPI: Luke Clayton is a 63 y.o. male seen for follow up of Acute on Chronic Respiratory Failure.  Patient continues on aerosol trach collar 30% FiO2 plan is to go down and get PEG tube placed today.  Medications: Reviewed on Rounds  Physical Exam:  Vitals: Pulse 77 respirations 30 BP 114/73 O2 sat 98% temp 98.9  Ventilator Settings ATC 35%  . General: Comfortable at this time . Eyes: Grossly normal lids, irises & conjunctiva . ENT: grossly tongue is normal . Neck: no obvious mass . Cardiovascular: S1 S2 normal no gallop . Respiratory: No rales or rhonchi noted . Abdomen: soft . Skin: no rash seen on limited exam . Musculoskeletal: not rigid . Psychiatric:unable to assess . Neurologic: no seizure no involuntary movements         Lab Data:   Basic Metabolic Panel: Recent Labs  Lab 02/28/19 1427 03/01/19 0723 03/02/19 0627 03/03/19 1051 03/05/19 0723  NA 146* 147* 145 146* 141  K 3.7 3.5 3.7 3.5 3.6  CL 114* 114* 113* 112* 108  CO2 22 22 23 25 26   GLUCOSE 269* 209* 161* 241* 165*  BUN 48* 50* 58* 38* 20  CREATININE 1.44* 1.40* 1.47* 1.07 0.88  CALCIUM 8.9 8.7* 8.2* 8.6* 8.3*  MG 2.2  --   --  2.1 1.9  PHOS  --   --   --  2.6  --     ABG: No results for input(s): PHART, PCO2ART, PO2ART, HCO3, O2SAT in the last 168 hours.  Liver Function Tests: Recent Labs  Lab 03/03/19 1051  ALBUMIN 2.0*   No results for input(s): LIPASE, AMYLASE in the last 168 hours. No results for input(s): AMMONIA in the last 168 hours.  CBC: Recent Labs  Lab 02/28/19 1427 03/01/19 0723 03/03/19 1138 03/05/19 0723  WBC 7.8 10.5 3.7* 4.0  HGB 12.4* 11.7* 8.3* 8.7*  HCT 40.6 39.3 28.0* 29.2*  MCV 89.4 90.3 92.1 90.7  PLT 226 178  108* 110*    Cardiac Enzymes: No results for input(s): CKTOTAL, CKMB, CKMBINDEX, TROPONINI in the last 168 hours.  BNP (last 3 results) No results for input(s): BNP in the last 8760 hours.  ProBNP (last 3 results) No results for input(s): PROBNP in the last 8760 hours.  Radiological Exams: Ir Gastrostomy Tube Mod Sed  Result Date: 03/05/2019 INDICATION: 63 year old with history of traumatic brain injury and needs enteral nutrition. EXAM: PERCUTANEOUS GASTROSTOMY TUBE WITH FLUOROSCOPIC GUIDANCE Physician: Rachelle HoraAdam R. Lowella DandyHenn, MD MEDICATIONS: Ancef 2 g; Antibiotics were administered within 1 hour of the procedure. Glucagon 1 mg IV ANESTHESIA/SEDATION: Versed 0.5 mg IV; Fentanyl 25 mcg IV Moderate Sedation Time:  13 minutes The patient was continuously monitored during the procedure by the interventional radiology nurse under my direct supervision. FLUOROSCOPY TIME:  Fluoroscopy Time: 2 minutes, 6 seconds, 16 mGy COMPLICATIONS: None immediate. PROCEDURE: Informed consent was obtained for a percutaneous gastrostomy tube. The patient was placed on the interventional table. Fluoroscopy demonstrated gas in the transverse colon. An orogastric tube was placed with fluoroscopic guidance. The anterior abdomen was prepped and draped in sterile fashion. Maximal barrier sterile technique was utilized including caps, mask, sterile gowns, sterile gloves, sterile drape, hand hygiene and skin antiseptic. Stomach was inflated with air through the orogastric tube.  The skin and subcutaneous tissues were anesthetized with 1% lidocaine. A 17 gauge needle was directed into the distended stomach with fluoroscopic guidance. A wire was advanced into the stomach and a T-tact was deployed. A 9-French vascular sheath was placed and the orogastric tube was snared using a Gooseneck snare device. The orogastric tube and snare were pulled out of the patient's mouth. The snare device was connected to a 20-French gastrostomy tube. The snare  device and gastrostomy tube were pulled through the patient's mouth and out the anterior abdominal wall. The gastrostomy tube was cut to an appropriate length. Contrast injection through gastrostomy tube confirmed placement within the stomach. Fluoroscopic images were obtained for documentation. The gastrostomy tube was flushed with normal saline. IMPRESSION: Successful fluoroscopic guided percutaneous gastrostomy tube placement. Electronically Signed   By: Markus Daft M.D.   On: 03/05/2019 15:31    Assessment/Plan Active Problems:   Acute on chronic respiratory failure with hypoxia (HCC)   Traumatic subarachnoid hemorrhage with loss of consciousness (HCC)   Lobar pneumonia, unspecified organism ([place])   Multiple closed fractures of cervical vertebrae (HCC)   Traumatic brain injury with loss of consciousness of one hour or more ([place])   1. Acute on chronic respiratory failure with hypoxia we will continue with T collar trials patient currently is on 35% FiO2 continue support measures. 2. Subarachnoid hemorrhage unchanged we will continue to follow 3. Lobar pneumonia unspecified treated we will continue monitoring radiologically. 4. Close cervical fractures patient is at baseline will need neurosurgical follow-up 5. Traumatic brain injury grossly unchanged   I have personally seen and evaluated the patient, evaluated laboratory and imaging results, formulated the assessment and plan and placed orders. The Patient requires high complexity decision making for assessment and support.  Case was discussed on Rounds with the Respiratory Therapy Staff  Allyne Gee, MD [facility] Pulmonary Critical Care Medicine Sleep Medicine

## 2019-03-06 DIAGNOSIS — J9621 Acute and chronic respiratory failure with hypoxia: Secondary | ICD-10-CM | POA: Diagnosis not present

## 2019-03-06 DIAGNOSIS — J181 Lobar pneumonia, unspecified organism: Secondary | ICD-10-CM | POA: Diagnosis not present

## 2019-03-06 DIAGNOSIS — S129XXS Fracture of neck, unspecified, sequela: Secondary | ICD-10-CM | POA: Diagnosis not present

## 2019-03-06 DIAGNOSIS — S069X9A Unspecified intracranial injury with loss of consciousness of unspecified duration, initial encounter: Secondary | ICD-10-CM | POA: Diagnosis not present

## 2019-03-06 NOTE — Progress Notes (Addendum)
[facility]   PULMONARY CRITICAL CARE SERVICE  PROGRESS NOTE  Date of Service: 03/06/2019  Luke Clayton  [id number]  DOB: May 13, 1956   DOA: 02/17/2019  Referring Physician: Carron CurieAli Hijazi, MD  HPI: Luke KocherLouis Sarate is a 63 y.o. male seen for follow up of Acute on Chronic Respiratory Failure.  Patient remains on T-bar 30% FiO2.  Medications: Reviewed on Rounds  Physical Exam:  Vitals: Pulse 75 respiration 28 BP 122/73 O2 sat 100% temp 96.7  Ventilator Settings T-bar 30%  . General: Comfortable at this time . Eyes: Grossly normal lids, irises & conjunctiva . ENT: grossly tongue is normal . Neck: no obvious mass . Cardiovascular: S1 S2 normal no gallop . Respiratory: No rales or rhonchi noted . Abdomen: soft . Skin: no rash seen on limited exam . Musculoskeletal: not rigid . Psychiatric:unable to assess . Neurologic: no seizure no involuntary movements         Lab Data:   Basic Metabolic Panel: Recent Labs  Lab 02/28/19 1427 03/01/19 0723 03/02/19 0627 03/03/19 1051 03/05/19 0723  NA 146* 147* 145 146* 141  K 3.7 3.5 3.7 3.5 3.6  CL 114* 114* 113* 112* 108  CO2 22 22 23 25 26   GLUCOSE 269* 209* 161* 241* 165*  BUN 48* 50* 58* 38* 20  CREATININE 1.44* 1.40* 1.47* 1.07 0.88  CALCIUM 8.9 8.7* 8.2* 8.6* 8.3*  MG 2.2  --   --  2.1 1.9  PHOS  --   --   --  2.6  --     ABG: No results for input(s): PHART, PCO2ART, PO2ART, HCO3, O2SAT in the last 168 hours.  Liver Function Tests: Recent Labs  Lab 03/03/19 1051  ALBUMIN 2.0*   No results for input(s): LIPASE, AMYLASE in the last 168 hours. No results for input(s): AMMONIA in the last 168 hours.  CBC: Recent Labs  Lab 02/28/19 1427 03/01/19 0723 03/03/19 1138 03/05/19 0723  WBC 7.8 10.5 3.7* 4.0  HGB 12.4* 11.7* 8.3* 8.7*  HCT 40.6 39.3 28.0* 29.2*  MCV 89.4 90.3 92.1 90.7  PLT 226 178 108* 110*    Cardiac Enzymes: No results for input(s): CKTOTAL,  CKMB, CKMBINDEX, TROPONINI in the last 168 hours.  BNP (last 3 results) No results for input(s): BNP in the last 8760 hours.  ProBNP (last 3 results) No results for input(s): PROBNP in the last 8760 hours.  Radiological Exams: Ir Gastrostomy Tube Mod Sed  Result Date: 03/05/2019 INDICATION: 63 year old with history of traumatic brain injury and needs enteral nutrition. EXAM: PERCUTANEOUS GASTROSTOMY TUBE WITH FLUOROSCOPIC GUIDANCE Physician: Rachelle HoraAdam R. Lowella DandyHenn, MD MEDICATIONS: Ancef 2 g; Antibiotics were administered within 1 hour of the procedure. Glucagon 1 mg IV ANESTHESIA/SEDATION: Versed 0.5 mg IV; Fentanyl 25 mcg IV Moderate Sedation Time:  13 minutes The patient was continuously monitored during the procedure by the interventional radiology nurse under my direct supervision. FLUOROSCOPY TIME:  Fluoroscopy Time: 2 minutes, 6 seconds, 16 mGy COMPLICATIONS: None immediate. PROCEDURE: Informed consent was obtained for a percutaneous gastrostomy tube. The patient was placed on the interventional table. Fluoroscopy demonstrated gas in the transverse colon. An orogastric tube was placed with fluoroscopic guidance. The anterior abdomen was prepped and draped in sterile fashion. Maximal barrier sterile technique was utilized including caps, mask, sterile gowns, sterile gloves, sterile drape, hand hygiene and skin antiseptic. Stomach was inflated with air through the orogastric tube. The skin and subcutaneous tissues were anesthetized with 1% lidocaine. A 17 gauge  needle was directed into the distended stomach with fluoroscopic guidance. A wire was advanced into the stomach and a T-tact was deployed. A 9-French vascular sheath was placed and the orogastric tube was snared using a Gooseneck snare device. The orogastric tube and snare were pulled out of the patient's mouth. The snare device was connected to a 20-French gastrostomy tube. The snare device and gastrostomy tube were pulled through the patient's mouth  and out the anterior abdominal wall. The gastrostomy tube was cut to an appropriate length. Contrast injection through gastrostomy tube confirmed placement within the stomach. Fluoroscopic images were obtained for documentation. The gastrostomy tube was flushed with normal saline. IMPRESSION: Successful fluoroscopic guided percutaneous gastrostomy tube placement. Electronically Signed   By: Markus Daft M.D.   On: 03/05/2019 15:31    Assessment/Plan Active Problems:   Acute on chronic respiratory failure with hypoxia (HCC)   Traumatic subarachnoid hemorrhage with loss of consciousness (HCC)   Lobar pneumonia, unspecified organism ([place])   Multiple closed fractures of cervical vertebrae (HCC)   Traumatic brain injury with loss of consciousness of one hour or more ([place])   1. Acute on chronic respiratory failure with hypoxia we will continue with T collar trials patient currently is on30% FiO2continue support measures. 2. Subarachnoid hemorrhage unchanged we will continue to follow 3. Lobar pneumonia unspecified treated we will continue monitoring radiologically. 4. Close cervical fractures patient is at baseline will need neurosurgical follow-up 5. Traumatic brain injury grossly unchanged   I have personally seen and evaluated the patient, evaluated laboratory and imaging results, formulated the assessment and plan and placed orders. The Patient requires high complexity decision making for assessment and support.  Case was discussed on Rounds with the Respiratory Therapy Staff  Allyne Gee, MD [facility] Pulmonary Critical Care Medicine Sleep Medicine

## 2019-03-07 DIAGNOSIS — J9621 Acute and chronic respiratory failure with hypoxia: Secondary | ICD-10-CM | POA: Diagnosis not present

## 2019-03-07 DIAGNOSIS — S069X9A Unspecified intracranial injury with loss of consciousness of unspecified duration, initial encounter: Secondary | ICD-10-CM | POA: Diagnosis not present

## 2019-03-07 DIAGNOSIS — J181 Lobar pneumonia, unspecified organism: Secondary | ICD-10-CM | POA: Diagnosis not present

## 2019-03-07 DIAGNOSIS — S129XXS Fracture of neck, unspecified, sequela: Secondary | ICD-10-CM | POA: Diagnosis not present

## 2019-03-07 LAB — CBC
HCT: 30.5 % — ABNORMAL LOW (ref 39.0–52.0)
Hemoglobin: 9.4 g/dL — ABNORMAL LOW (ref 13.0–17.0)
MCH: 26.9 pg (ref 26.0–34.0)
MCHC: 30.8 g/dL (ref 30.0–36.0)
MCV: 87.4 fL (ref 80.0–100.0)
Platelets: 128 10*3/uL — ABNORMAL LOW (ref 150–400)
RBC: 3.49 MIL/uL — ABNORMAL LOW (ref 4.22–5.81)
RDW: 18.3 % — ABNORMAL HIGH (ref 11.5–15.5)
WBC: 5 10*3/uL (ref 4.0–10.5)
nRBC: 0 % (ref 0.0–0.2)

## 2019-03-07 LAB — CULTURE, BLOOD (ROUTINE X 2)
Culture: NO GROWTH
Special Requests: ADEQUATE

## 2019-03-07 LAB — BASIC METABOLIC PANEL
Anion gap: 10 (ref 5–15)
BUN: 16 mg/dL (ref 8–23)
CO2: 25 mmol/L (ref 22–32)
Calcium: 8.4 mg/dL — ABNORMAL LOW (ref 8.9–10.3)
Chloride: 103 mmol/L (ref 98–111)
Creatinine, Ser: 0.83 mg/dL (ref 0.61–1.24)
GFR calc Af Amer: >60 mL/min (ref >60)
GFR calc non Af Amer: >60 mL/min (ref >60)
Glucose, Bld: 159 mg/dL — ABNORMAL HIGH (ref 70–99)
Potassium: 3.5 mmol/L (ref 3.5–5.1)
Sodium: 138 mmol/L (ref 135–145)

## 2019-03-07 LAB — PHOSPHORUS: Phosphorus: 2.8 mg/dL (ref 2.5–4.6)

## 2019-03-07 LAB — MAGNESIUM: Magnesium: 2 mg/dL (ref 1.7–2.4)

## 2019-03-07 NOTE — Progress Notes (Addendum)
Pulmonary Critical Care Medicine [place]   PULMONARY CRITICAL CARE SERVICE  PROGRESS NOTE  Date of Service: 03/07/2019  Kennard Fildes  [id number]:[id number]  DOB: Jan 15, 1956   DOA: 02/17/2019  Referring Physician: Merton Border, MD  HPI: Adrean Heitz is a 63 y.o. male seen for follow up of Acute on Chronic Respiratory Failure.  Patient is on T-bar 28% FiO2 satting well with no fever or distress.  Medications: Reviewed on Rounds  Physical Exam:  Vitals: Pulse 93 respiration 22 BP 105/65 O2 sat 98% temp 98.4  Ventilator Settings T-bar 28%  . General: Comfortable at this time . Eyes: Grossly normal lids, irises & conjunctiva . ENT: grossly tongue is normal . Neck: no obvious mass . Cardiovascular: S1 S2 normal no gallop . Respiratory: No rales or rhonchi noted . Abdomen: soft . Skin: no rash seen on limited exam . Musculoskeletal: not rigid . Psychiatric:unable to assess . Neurologic: no seizure no involuntary movements         Lab Data:   Basic Metabolic Panel: Recent Labs  Lab 03/01/19 0723 03/02/19 0627 03/03/19 1051 03/05/19 0723 03/07/19 0621  NA 147* 145 146* 141 138  K 3.5 3.7 3.5 3.6 3.5  CL 114* 113* 112* 108 103  CO2 22 23 25 26 25   GLUCOSE 209* 161* 241* 165* 159*  BUN 50* 58* 38* 20 16  CREATININE 1.40* 1.47* 1.07 0.88 0.83  CALCIUM 8.7* 8.2* 8.6* 8.3* 8.4*  MG  --   --  2.1 1.9 2.0  PHOS  --   --  2.6  --  2.8    ABG: No results for input(s): PHART, PCO2ART, PO2ART, HCO3, O2SAT in the last 168 hours.  Liver Function Tests: Recent Labs  Lab 03/03/19 1051  ALBUMIN 2.0*   No results for input(s): LIPASE, AMYLASE in the last 168 hours. No results for input(s): AMMONIA in the last 168 hours.  CBC: Recent Labs  Lab 03/01/19 0723 03/03/19 1138 03/05/19 0723 03/07/19 0621  WBC 10.5 3.7* 4.0 5.0  HGB 11.7* 8.3* 8.7* 9.4*  HCT 39.3 28.0* 29.2* 30.5*  MCV 90.3 92.1 90.7 87.4  PLT 178 108* 110* 128*    Cardiac Enzymes: No  results for input(s): CKTOTAL, CKMB, CKMBINDEX, TROPONINI in the last 168 hours.  BNP (last 3 results) No results for input(s): BNP in the last 8760 hours.  ProBNP (last 3 results) No results for input(s): PROBNP in the last 8760 hours.  Radiological Exams: No results found.  Assessment/Plan Active Problems:   Acute on chronic respiratory failure with hypoxia (HCC)   Traumatic subarachnoid hemorrhage with loss of consciousness (HCC)   Lobar pneumonia, unspecified organism ([place])   Multiple closed fractures of cervical vertebrae (HCC)   Traumatic brain injury with loss of consciousness of one hour or more ([place])   1. Acute on chronic respiratory failure with hypoxia we will continue with T collar trials patient currently is on28% FiO2continue support measures. 2. Subarachnoid hemorrhage unchanged we will continue to follow 3. Lobar pneumonia unspecified treated we will continue monitoring radiologically. 4. Close cervical fractures patient is at baseline will need neurosurgical follow-up 5. Traumatic brain injury grossly unchanged   I have personally seen and evaluated the patient, evaluated laboratory and imaging results, formulated the assessment and plan and placed orders. The Patient requires high complexity decision making for assessment and support.  Case was discussed on Rounds with the Respiratory Therapy Staff  Allyne Gee, MD [facility] Pulmonary Critical Care Medicine Sleep Medicine

## 2019-03-08 DIAGNOSIS — S129XXS Fracture of neck, unspecified, sequela: Secondary | ICD-10-CM | POA: Diagnosis not present

## 2019-03-08 DIAGNOSIS — J181 Lobar pneumonia, unspecified organism: Secondary | ICD-10-CM | POA: Diagnosis not present

## 2019-03-08 DIAGNOSIS — J9621 Acute and chronic respiratory failure with hypoxia: Secondary | ICD-10-CM | POA: Diagnosis not present

## 2019-03-08 DIAGNOSIS — S069X9A Unspecified intracranial injury with loss of consciousness of unspecified duration, initial encounter: Secondary | ICD-10-CM | POA: Diagnosis not present

## 2019-03-08 NOTE — Progress Notes (Addendum)
Pulmonary Critical Care Medicine [place]   PULMONARY CRITICAL CARE SERVICE  PROGRESS NOTE  Date of Service: 03/08/2019  Luke Clayton  [id number]  DOB: 1956-03-28   DOA: 02/17/2019  Referring Physician: Merton Border, MD  HPI: Luke Clayton is a 63 y.o. male seen for follow up of Acute on Chronic Respiratory Failure.  Patient is on aerosol trach collar 20% FiO2 satting well with no fever or distress.  Medications: Reviewed on Rounds  Physical Exam:  Vitals: Pulse 90 respirations 24 BP 90/61 O2 sat 98% temp 98.8  Ventilator Settings ATC 28%  . General: Comfortable at this time . Eyes: Grossly normal lids, irises & conjunctiva . ENT: grossly tongue is normal . Neck: no obvious mass . Cardiovascular: S1 S2 normal no gallop . Respiratory: no rales or ronchi noted . Abdomen: soft . Skin: no rash seen on limited exam . Musculoskeletal: not rigid . Psychiatric:unable to assess . Neurologic: no seizure no involuntary movements         Lab Data:   Basic Metabolic Panel: Recent Labs  Lab 03/02/19 0627 03/03/19 1051 03/05/19 0723 03/07/19 0621  NA 145 146* 141 138  K 3.7 3.5 3.6 3.5  CL 113* 112* 108 103  CO2 23 25 26 25   GLUCOSE 161* 241* 165* 159*  BUN 58* 38* 20 16  CREATININE 1.47* 1.07 0.88 0.83  CALCIUM 8.2* 8.6* 8.3* 8.4*  MG  --  2.1 1.9 2.0  PHOS  --  2.6  --  2.8    ABG: No results for input(s): PHART, PCO2ART, PO2ART, HCO3, O2SAT in the last 168 hours.  Liver Function Tests: Recent Labs  Lab 03/03/19 1051  ALBUMIN 2.0*   No results for input(s): LIPASE, AMYLASE in the last 168 hours. No results for input(s): AMMONIA in the last 168 hours.  CBC: Recent Labs  Lab 03/03/19 1138 03/05/19 0723 03/07/19 0621  WBC 3.7* 4.0 5.0  HGB 8.3* 8.7* 9.4*  HCT 28.0* 29.2* 30.5*  MCV 92.1 90.7 87.4  PLT 108* 110* 128*    Cardiac Enzymes: No results for input(s): CKTOTAL, CKMB, CKMBINDEX, TROPONINI in the last 168 hours.  BNP  (last 3 results) No results for input(s): BNP in the last 8760 hours.  ProBNP (last 3 results) No results for input(s): PROBNP in the last 8760 hours.  Radiological Exams: No results found.  Assessment/Plan Active Problems:   Acute on chronic respiratory failure with hypoxia (HCC)   Traumatic subarachnoid hemorrhage with loss of consciousness (HCC)   Lobar pneumonia, unspecified organism ([place])   Multiple closed fractures of cervical vertebrae (HCC)   Traumatic brain injury with loss of consciousness of one hour or more ([place])   1. Acute on chronic respiratory failure with hypoxia we will continue with T collar trials patient currently is on28% FiO2continue support measures. 2. Subarachnoid hemorrhage unchanged we will continue to follow 3. Lobar pneumonia unspecified treated we will continue monitoring radiologically. 4. Close cervical fractures patient is at baseline will need neurosurgical follow-up 5. Traumatic brain injury grossly unchanged   I have personally seen and evaluated the patient, evaluated laboratory and imaging results, formulated the assessment and plan and placed orders. The Patient requires high complexity decision making for assessment and support.  Case was discussed on Rounds with the Respiratory Therapy Staff  Allyne Gee, MD [facility] Pulmonary Critical Care Medicine Sleep Medicine

## 2019-03-09 DIAGNOSIS — S129XXS Fracture of neck, unspecified, sequela: Secondary | ICD-10-CM | POA: Diagnosis not present

## 2019-03-09 DIAGNOSIS — J181 Lobar pneumonia, unspecified organism: Secondary | ICD-10-CM | POA: Diagnosis not present

## 2019-03-09 DIAGNOSIS — S069X9A Unspecified intracranial injury with loss of consciousness of unspecified duration, initial encounter: Secondary | ICD-10-CM | POA: Diagnosis not present

## 2019-03-09 DIAGNOSIS — J9621 Acute and chronic respiratory failure with hypoxia: Secondary | ICD-10-CM | POA: Diagnosis not present

## 2019-03-09 LAB — BASIC METABOLIC PANEL
Anion gap: 9 (ref 5–15)
BUN: 16 mg/dL (ref 8–23)
CO2: 26 mmol/L (ref 22–32)
Calcium: 8.8 mg/dL — ABNORMAL LOW (ref 8.9–10.3)
Chloride: 103 mmol/L (ref 98–111)
Creatinine, Ser: 0.89 mg/dL (ref 0.61–1.24)
GFR calc Af Amer: >60 mL/min (ref >60)
GFR calc non Af Amer: >60 mL/min (ref >60)
Glucose, Bld: 168 mg/dL — ABNORMAL HIGH (ref 70–99)
Potassium: 3.7 mmol/L (ref 3.5–5.1)
Sodium: 138 mmol/L (ref 135–145)

## 2019-03-09 LAB — CBC
HCT: 31.4 % — ABNORMAL LOW (ref 39.0–52.0)
Hemoglobin: 9.5 g/dL — ABNORMAL LOW (ref 13.0–17.0)
MCH: 27.5 pg (ref 26.0–34.0)
MCHC: 30.3 g/dL (ref 30.0–36.0)
MCV: 91 fL (ref 80.0–100.0)
Platelets: 153 10*3/uL (ref 150–400)
RBC: 3.45 MIL/uL — ABNORMAL LOW (ref 4.22–5.81)
RDW: 19.8 % — ABNORMAL HIGH (ref 11.5–15.5)
WBC: 4.9 10*3/uL (ref 4.0–10.5)
nRBC: 0 % (ref 0.0–0.2)

## 2019-03-09 LAB — PHOSPHORUS: Phosphorus: 3.2 mg/dL (ref 2.5–4.6)

## 2019-03-09 LAB — MAGNESIUM: Magnesium: 2 mg/dL (ref 1.7–2.4)

## 2019-03-09 NOTE — Progress Notes (Addendum)
Pulmonary Critical Care Medicine [place]   PULMONARY CRITICAL CARE SERVICE  PROGRESS NOTE  Date of Service: 03/09/2019  Luke Clayton  FYB:[id number]  DOB: [birth year]   DOA: 02/17/2019  Referring Physician: Merton Border, MD  HPI: Luke Clayton is a 63 y.o. male seen for follow up of Acute on Chronic Respiratory Failure.  Patient is on aerosol trach collar 20% FiO2 with PMV satting well with no distress.  Medications: Reviewed on Rounds  Physical Exam:  Vitals: Pulse 93 respiration 27 BP 143/86 O2 sat 97% temp 98.8  Ventilator Settings AC 28%  . General: Comfortable at this time . Eyes: Grossly normal lids, irises & conjunctiva . ENT: grossly tongue is normal . Neck: no obvious mass . Cardiovascular: S1 S2 normal no gallop . Respiratory: No rales or rhonchi noted . Abdomen: soft . Skin: no rash seen on limited exam . Musculoskeletal: not rigid . Psychiatric:unable to assess . Neurologic: no seizure no involuntary movements         Lab Data:   Basic Metabolic Panel: Recent Labs  Lab 03/03/19 1051 03/05/19 0723 03/07/19 0621 03/09/19 0700  NA 146* 141 138 138  K 3.5 3.6 3.5 3.7  CL 112* 108 103 103  CO2 25 26 25 26   GLUCOSE 241* 165* 159* 168*  BUN 38* 20 16 16   CREATININE 1.07 0.88 0.83 0.89  CALCIUM 8.6* 8.3* 8.4* 8.8*  MG 2.1 1.9 2.0 2.0  PHOS 2.6  --  2.8 3.2    ABG: No results for input(s): PHART, PCO2ART, PO2ART, HCO3, O2SAT in the last 168 hours.  Liver Function Tests: Recent Labs  Lab 03/03/19 1051  ALBUMIN 2.0*   No results for input(s): LIPASE, AMYLASE in the last 168 hours. No results for input(s): AMMONIA in the last 168 hours.  CBC: Recent Labs  Lab 03/03/19 1138 03/05/19 0723 03/07/19 0621 03/09/19 0700  WBC 3.7* 4.0 5.0 4.9  HGB 8.3* 8.7* 9.4* 9.5*  HCT 28.0* 29.2* 30.5* 31.4*  MCV 92.1 90.7 87.4 91.0  PLT 108* 110* 128* 153    Cardiac Enzymes: No results for input(s): CKTOTAL, CKMB, CKMBINDEX, TROPONINI  in the last 168 hours.  BNP (last 3 results) No results for input(s): BNP in the last 8760 hours.  ProBNP (last 3 results) No results for input(s): PROBNP in the last 8760 hours.  Radiological Exams: No results found.  Assessment/Plan Active Problems:   Acute on chronic respiratory failure with hypoxia (HCC)   Traumatic subarachnoid hemorrhage with loss of consciousness (HCC)   Lobar pneumonia, unspecified organism ([place])   Multiple closed fractures of cervical vertebrae (HCC)   Traumatic brain injury with loss of consciousness of one hour or more ([place])   1. Acute on chronic respiratory failure with hypoxia we will continue with T collar trials patient currently is on28% FiO2continue support measures. 2. Subarachnoid hemorrhage unchanged we will continue to follow 3. Lobar pneumonia unspecified treated we will continue monitoring radiologically. 4. Close cervical fractures patient is at baseline will need neurosurgical follow-up 5. Traumatic brain injury grossly unchanged   I have personally seen and evaluated the patient, evaluated laboratory and imaging results, formulated the assessment and plan and placed orders. The Patient requires high complexity decision making for assessment and support.  Case was discussed on Rounds with the Respiratory Therapy Staff  Allyne Gee, MD [facility] Pulmonary Critical Care Medicine Sleep Medicine

## 2019-03-10 DIAGNOSIS — S069X9A Unspecified intracranial injury with loss of consciousness of unspecified duration, initial encounter: Secondary | ICD-10-CM | POA: Diagnosis not present

## 2019-03-10 DIAGNOSIS — J9621 Acute and chronic respiratory failure with hypoxia: Secondary | ICD-10-CM | POA: Diagnosis not present

## 2019-03-10 DIAGNOSIS — S129XXS Fracture of neck, unspecified, sequela: Secondary | ICD-10-CM | POA: Diagnosis not present

## 2019-03-10 DIAGNOSIS — J181 Lobar pneumonia, unspecified organism: Secondary | ICD-10-CM | POA: Diagnosis not present

## 2019-03-10 NOTE — Progress Notes (Addendum)
Pulmonary Critical Care Medicine [place]   PULMONARY CRITICAL CARE SERVICE  PROGRESS NOTE  Date of Service: 03/10/2019  Luke Clayton  OVZ:[id number]  DOB: 10-06-1955   DOA: 02/17/2019  Referring Physician: Merton Border, MD  HPI: Luke Clayton is a 63 y.o. male seen for follow up of Acute on Chronic Respiratory Failure.  Patient made aerosol trach collar 28% FiO2 using PMV with no difficulty.  Medications: Reviewed on Rounds  Physical Exam:  Vitals: Pulse 87 respirations 26 BP 133/78 O2 sat 97% temp 98.9  Ventilator Settings ATC 28%  . General: Comfortable at this time . Eyes: Grossly normal lids, irises & conjunctiva . ENT: grossly tongue is normal . Neck: no obvious mass . Cardiovascular: S1 S2 normal no gallop . Respiratory: No rales or rhonchi noted . Abdomen: soft . Skin: no rash seen on limited exam . Musculoskeletal: not rigid . Psychiatric:unable to assess . Neurologic: no seizure no involuntary movements         Lab Data:   Basic Metabolic Panel: Recent Labs  Lab 03/05/19 0723 03/07/19 0621 03/09/19 0700  NA 141 138 138  K 3.6 3.5 3.7  CL 108 103 103  CO2 26 25 26   GLUCOSE 165* 159* 168*  BUN 20 16 16   CREATININE 0.88 0.83 0.89  CALCIUM 8.3* 8.4* 8.8*  MG 1.9 2.0 2.0  PHOS  --  2.8 3.2    ABG: No results for input(s): PHART, PCO2ART, PO2ART, HCO3, O2SAT in the last 168 hours.  Liver Function Tests: No results for input(s): AST, ALT, ALKPHOS, BILITOT, PROT, ALBUMIN in the last 168 hours. No results for input(s): LIPASE, AMYLASE in the last 168 hours. No results for input(s): AMMONIA in the last 168 hours.  CBC: Recent Labs  Lab 03/05/19 0723 03/07/19 0621 03/09/19 0700  WBC 4.0 5.0 4.9  HGB 8.7* 9.4* 9.5*  HCT 29.2* 30.5* 31.4*  MCV 90.7 87.4 91.0  PLT 110* 128* 153    Cardiac Enzymes: No results for input(s): CKTOTAL, CKMB, CKMBINDEX, TROPONINI in the last 168 hours.  BNP (last 3 results) No results for  input(s): BNP in the last 8760 hours.  ProBNP (last 3 results) No results for input(s): PROBNP in the last 8760 hours.  Radiological Exams: No results found.  Assessment/Plan Active Problems:   Acute on chronic respiratory failure with hypoxia (HCC)   Traumatic subarachnoid hemorrhage with loss of consciousness (HCC)   Lobar pneumonia, unspecified organism ([place])   Multiple closed fractures of cervical vertebrae (HCC)   Traumatic brain injury with loss of consciousness of one hour or more ([place])   1. Acute on chronic respiratory failure with hypoxia we will continue with T collar trials patient currently is on28% FiO2continue support measures. 2. Subarachnoid hemorrhage unchanged we will continue to follow 3. Lobar pneumonia unspecified treated we will continue monitoring radiologically. 4. Close cervical fractures patient is at baseline will need neurosurgical follow-up 5. Traumatic brain injury grossly unchanged   I have personally seen and evaluated the patient, evaluated laboratory and imaging results, formulated the assessment and plan and placed orders. The Patient requires high complexity decision making for assessment and support.  Case was discussed on Rounds with the Respiratory Therapy Staff  Allyne Gee, MD [facility] Pulmonary Critical Care Medicine Sleep Medicine

## 2019-03-11 DIAGNOSIS — S069X9A Unspecified intracranial injury with loss of consciousness of unspecified duration, initial encounter: Secondary | ICD-10-CM | POA: Diagnosis not present

## 2019-03-11 DIAGNOSIS — S129XXS Fracture of neck, unspecified, sequela: Secondary | ICD-10-CM | POA: Diagnosis not present

## 2019-03-11 DIAGNOSIS — J9621 Acute and chronic respiratory failure with hypoxia: Secondary | ICD-10-CM | POA: Diagnosis not present

## 2019-03-11 DIAGNOSIS — J181 Lobar pneumonia, unspecified organism: Secondary | ICD-10-CM | POA: Diagnosis not present

## 2019-03-11 NOTE — Progress Notes (Addendum)
Pulmonary Critical Care Medicine [place]   PULMONARY CRITICAL CARE SERVICE  PROGRESS NOTE  Date of Service: 03/11/2019  Luke Clayton  [id number]:[id number]  DOB: 1956-06-30   DOA: 02/17/2019  Referring Physician: Merton Border, MD  HPI: Luke Clayton is a 63 y.o. male seen for follow up of Acute on Chronic Respiratory Failure.  Patient remains on aerosol trach collar 28%.  Continue using PMV  Medications: Reviewed on Rounds  Physical Exam:  Vitals: Pulse 105 respirations 32 BP 138/84 O2 sat 97% temp 98.9  Ventilator Settings AC 28%  . General: Comfortable at this time . Eyes: Grossly normal lids, irises & conjunctiva . ENT: grossly tongue is normal . Neck: no obvious mass . Cardiovascular: S1 S2 normal no gallop . Respiratory: No rales or rhonchi noted . Abdomen: soft . Skin: no rash seen on limited exam . Musculoskeletal: not rigid . Psychiatric:unable to assess . Neurologic: no seizure no involuntary movements         Lab Data:   Basic Metabolic Panel: Recent Labs  Lab 03/05/19 0723 03/07/19 0621 03/09/19 0700  NA 141 138 138  K 3.6 3.5 3.7  CL 108 103 103  CO2 26 25 26   GLUCOSE 165* 159* 168*  BUN 20 16 16   CREATININE 0.88 0.83 0.89  CALCIUM 8.3* 8.4* 8.8*  MG 1.9 2.0 2.0  PHOS  --  2.8 3.2    ABG: No results for input(s): PHART, PCO2ART, PO2ART, HCO3, O2SAT in the last 168 hours.  Liver Function Tests: No results for input(s): AST, ALT, ALKPHOS, BILITOT, PROT, ALBUMIN in the last 168 hours. No results for input(s): LIPASE, AMYLASE in the last 168 hours. No results for input(s): AMMONIA in the last 168 hours.  CBC: Recent Labs  Lab 03/05/19 0723 03/07/19 0621 03/09/19 0700  WBC 4.0 5.0 4.9  HGB 8.7* 9.4* 9.5*  HCT 29.2* 30.5* 31.4*  MCV 90.7 87.4 91.0  PLT 110* 128* 153    Cardiac Enzymes: No results for input(s): CKTOTAL, CKMB, CKMBINDEX, TROPONINI in the last 168 hours.  BNP (last 3 results) No results for input(s): BNP  in the last 8760 hours.  ProBNP (last 3 results) No results for input(s): PROBNP in the last 8760 hours.  Radiological Exams: No results found.  Assessment/Plan Active Problems:   Acute on chronic respiratory failure with hypoxia (HCC)   Traumatic subarachnoid hemorrhage with loss of consciousness (HCC)   Lobar pneumonia, unspecified organism ([place])   Multiple closed fractures of cervical vertebrae (HCC)   Traumatic brain injury with loss of consciousness of one hour or more ([place])   1. Acute on chronic respiratory failure with hypoxia we will continue with T collar trials patient currently is on28% FiO2continue support measures. 2. Subarachnoid hemorrhage unchanged we will continue to follow 3. Lobar pneumonia unspecified treated we will continue monitoring radiologically. 4. Close cervical fractures patient is at baseline will need neurosurgical follow-up 5. Traumatic brain injury grossly unchanged   I have personally seen and evaluated the patient, evaluated laboratory and imaging results, formulated the assessment and plan and placed orders. The Patient requires high complexity decision making for assessment and support.  Case was discussed on Rounds with the Respiratory Therapy Staff  Allyne Gee, MD [facility] Pulmonary Critical Care Medicine Sleep Medicine

## 2019-03-12 DIAGNOSIS — J181 Lobar pneumonia, unspecified organism: Secondary | ICD-10-CM | POA: Diagnosis not present

## 2019-03-12 DIAGNOSIS — J9621 Acute and chronic respiratory failure with hypoxia: Secondary | ICD-10-CM | POA: Diagnosis not present

## 2019-03-12 DIAGNOSIS — S069X9A Unspecified intracranial injury with loss of consciousness of unspecified duration, initial encounter: Secondary | ICD-10-CM | POA: Diagnosis not present

## 2019-03-12 DIAGNOSIS — S129XXS Fracture of neck, unspecified, sequela: Secondary | ICD-10-CM | POA: Diagnosis not present

## 2019-03-12 NOTE — Progress Notes (Signed)
Pulmonary Critical Care Medicine [place]   PULMONARY CRITICAL CARE SERVICE  PROGRESS NOTE  Date of Service: 03/12/2019  Luke Clayton  MPN:[id number]  DOB: [birth year]   DOA: 02/17/2019  Referring Physician: Merton Border, MD  HPI: Luke Clayton is a 63 y.o. male seen for follow up of Acute on Chronic Respiratory Failure.  Patient is on T collar currently on 28% FiO2 good saturations are noted  Medications: Reviewed on Rounds  Physical Exam:  Vitals: Temperature 97.6 pulse 91 respiratory rate 29 blood pressure 105/71 saturations 98%  Ventilator Settings off the ventilator right now on T collar  . General: Comfortable at this time . Eyes: Grossly normal lids, irises & conjunctiva . ENT: grossly tongue is normal . Neck: no obvious mass . Cardiovascular: S1 S2 normal no gallop . Respiratory: No rhonchi coarse breath sounds . Abdomen: soft . Skin: no rash seen on limited exam . Musculoskeletal: not rigid . Psychiatric:unable to assess . Neurologic: no seizure no involuntary movements         Lab Data:   Basic Metabolic Panel: Recent Labs  Lab 03/07/19 0621 03/09/19 0700  NA 138 138  K 3.5 3.7  CL 103 103  CO2 25 26  GLUCOSE 159* 168*  BUN 16 16  CREATININE 0.83 0.89  CALCIUM 8.4* 8.8*  MG 2.0 2.0  PHOS 2.8 3.2    ABG: No results for input(s): PHART, PCO2ART, PO2ART, HCO3, O2SAT in the last 168 hours.  Liver Function Tests: No results for input(s): AST, ALT, ALKPHOS, BILITOT, PROT, ALBUMIN in the last 168 hours. No results for input(s): LIPASE, AMYLASE in the last 168 hours. No results for input(s): AMMONIA in the last 168 hours.  CBC: Recent Labs  Lab 03/07/19 0621 03/09/19 0700  WBC 5.0 4.9  HGB 9.4* 9.5*  HCT 30.5* 31.4*  MCV 87.4 91.0  PLT 128* 153    Cardiac Enzymes: No results for input(s): CKTOTAL, CKMB, CKMBINDEX, TROPONINI in the last 168 hours.  BNP (last 3 results) No results for input(s): BNP in the last 8760  hours.  ProBNP (last 3 results) No results for input(s): PROBNP in the last 8760 hours.  Radiological Exams: No results found.  Assessment/Plan Active Problems:   Acute on chronic respiratory failure with hypoxia (HCC)   Traumatic subarachnoid hemorrhage with loss of consciousness (HCC)   Lobar pneumonia, unspecified organism ([place])   Multiple closed fractures of cervical vertebrae (HCC)   Traumatic brain injury with loss of consciousness of one hour or more ([place])   1. Acute on chronic respiratory failure with hypoxia we will continue with T collar trials titrate oxygen secretions are still reportedly copious 2. Traumatic subarachnoid hemorrhage unchanged we will continue his present management 3. Lobar pneumonia treated 4. Multiple cervical fractures at baseline right now we will continue with supportive care 5. Traumatic brain injury patient is unchanged   I have personally seen and evaluated the patient, evaluated laboratory and imaging results, formulated the assessment and plan and placed orders. The Patient requires high complexity decision making for assessment and support.  Case was discussed on Rounds with the Respiratory Therapy Staff  Allyne Gee, MD [facility] Pulmonary Critical Care Medicine Sleep Medicine

## 2019-03-13 DIAGNOSIS — S069X9A Unspecified intracranial injury with loss of consciousness of unspecified duration, initial encounter: Secondary | ICD-10-CM | POA: Diagnosis not present

## 2019-03-13 DIAGNOSIS — S129XXS Fracture of neck, unspecified, sequela: Secondary | ICD-10-CM | POA: Diagnosis not present

## 2019-03-13 DIAGNOSIS — J181 Lobar pneumonia, unspecified organism: Secondary | ICD-10-CM | POA: Diagnosis not present

## 2019-03-13 DIAGNOSIS — J9621 Acute and chronic respiratory failure with hypoxia: Secondary | ICD-10-CM | POA: Diagnosis not present

## 2019-03-13 LAB — CBC
HCT: 30.4 % — ABNORMAL LOW (ref 39.0–52.0)
Hemoglobin: 9.4 g/dL — ABNORMAL LOW (ref 13.0–17.0)
MCH: 27.5 pg (ref 26.0–34.0)
MCHC: 30.9 g/dL (ref 30.0–36.0)
MCV: 88.9 fL (ref 80.0–100.0)
Platelets: 184 10*3/uL (ref 150–400)
RBC: 3.42 MIL/uL — ABNORMAL LOW (ref 4.22–5.81)
RDW: 19.4 % — ABNORMAL HIGH (ref 11.5–15.5)
WBC: 5.2 10*3/uL (ref 4.0–10.5)
nRBC: 0 % (ref 0.0–0.2)

## 2019-03-13 LAB — BASIC METABOLIC PANEL
Anion gap: 7 (ref 5–15)
BUN: 17 mg/dL (ref 8–23)
CO2: 30 mmol/L (ref 22–32)
Calcium: 8.9 mg/dL (ref 8.9–10.3)
Chloride: 97 mmol/L — ABNORMAL LOW (ref 98–111)
Creatinine, Ser: 0.92 mg/dL (ref 0.61–1.24)
GFR calc Af Amer: >60 mL/min (ref >60)
GFR calc non Af Amer: >60 mL/min (ref >60)
Glucose, Bld: 173 mg/dL — ABNORMAL HIGH (ref 70–99)
Potassium: 4.2 mmol/L (ref 3.5–5.1)
Sodium: 134 mmol/L — ABNORMAL LOW (ref 135–145)

## 2019-03-13 LAB — MAGNESIUM: Magnesium: 2 mg/dL (ref 1.7–2.4)

## 2019-03-13 NOTE — Progress Notes (Signed)
Pulmonary Critical Care Medicine [place]   PULMONARY CRITICAL CARE SERVICE  PROGRESS NOTE  Date of Service: 03/13/2019  Luke Clayton  NAT:[id number]  DOB: [birth year]   DOA: 02/17/2019  Referring Physician: Merton Border, MD  HPI: Luke Clayton is a 63 y.o. male seen for follow up of Acute on Chronic Respiratory Failure.  Patient is currently on T collar we are trying to figure out if the cervical spine is actually stable enough to let us change the tracheostomy out to a #6 cuffless trach  Medications: Reviewed on Rounds  Physical Exam:  Vitals: Temperature 98.8 pulse 102 respiratory 26 blood pressure 120/62 saturations 96%  Ventilator Settings off the ventilator on T collar FiO2 28%  . General: Comfortable at this time . Eyes: Grossly normal lids, irises & conjunctiva . ENT: grossly tongue is normal . Neck: no obvious mass . Cardiovascular: S1 S2 normal no gallop . Respiratory: No rhonchi coarse breath . Abdomen: soft . Skin: no rash seen on limited exam . Musculoskeletal: not rigid . Psychiatric:unable to assess . Neurologic: no seizure no involuntary movements         Lab Data:   Basic Metabolic Panel: Recent Labs  Lab 03/07/19 0621 03/09/19 0700 03/13/19 0559  NA 138 138 134*  K 3.5 3.7 4.2  CL 103 103 97*  CO2 25 26 30   GLUCOSE 159* 168* 173*  BUN 16 16 17   CREATININE 0.83 0.89 0.92  CALCIUM 8.4* 8.8* 8.9  MG 2.0 2.0 2.0  PHOS 2.8 3.2  --     ABG: No results for input(s): PHART, PCO2ART, PO2ART, HCO3, O2SAT in the last 168 hours.  Liver Function Tests: No results for input(s): AST, ALT, ALKPHOS, BILITOT, PROT, ALBUMIN in the last 168 hours. No results for input(s): LIPASE, AMYLASE in the last 168 hours. No results for input(s): AMMONIA in the last 168 hours.  CBC: Recent Labs  Lab 03/07/19 0621 03/09/19 0700 03/13/19 0559  WBC 5.0 4.9 5.2  HGB 9.4* 9.5* 9.4*  HCT 30.5* 31.4* 30.4*  MCV 87.4 91.0 88.9  PLT 128* 153 184     Cardiac Enzymes: No results for input(s): CKTOTAL, CKMB, CKMBINDEX, TROPONINI in the last 168 hours.  BNP (last 3 results) No results for input(s): BNP in the last 8760 hours.  ProBNP (last 3 results) No results for input(s): PROBNP in the last 8760 hours.  Radiological Exams: No results found.  Assessment/Plan Active Problems:   Acute on chronic respiratory failure with hypoxia (HCC)   Traumatic subarachnoid hemorrhage with loss of consciousness (HCC)   Lobar pneumonia, unspecified organism ([place])   Multiple closed fractures of cervical vertebrae (HCC)   Traumatic brain injury with loss of consciousness of one hour or more ([place])   1. Acute on chronic respiratory failure with hypoxia we will continue with the T collar wean get a CT of the spine hopefully if it gets cleared then we can change the trach out to a #6 cuffless trach 2. Traumatic subarachnoid hemorrhage at baseline no changes noted 3. Pneumonia treated we will continue to follow along 4. Multiple fractures of the cervical vertebral get follow-up CT spine 5. Traumatic brain injury grossly unchanged   I have personally seen and evaluated the patient, evaluated laboratory and imaging results, formulated the assessment and plan and placed orders. The Patient requires high complexity decision making for assessment and support.  Case was discussed on Rounds with the Respiratory Therapy Staff  Allyne Gee, MD [facility] Pulmonary Critical  Care Medicine Sleep Medicine

## 2019-03-14 ENCOUNTER — Institutional Professional Consult (permissible substitution) (HOSPITAL_COMMUNITY): Payer: BC Managed Care – PPO

## 2019-03-14 DIAGNOSIS — J181 Lobar pneumonia, unspecified organism: Secondary | ICD-10-CM | POA: Diagnosis not present

## 2019-03-14 DIAGNOSIS — J9621 Acute and chronic respiratory failure with hypoxia: Secondary | ICD-10-CM | POA: Diagnosis not present

## 2019-03-14 DIAGNOSIS — S129XXS Fracture of neck, unspecified, sequela: Secondary | ICD-10-CM | POA: Diagnosis not present

## 2019-03-14 DIAGNOSIS — S069X9A Unspecified intracranial injury with loss of consciousness of unspecified duration, initial encounter: Secondary | ICD-10-CM | POA: Diagnosis not present

## 2019-03-14 NOTE — Progress Notes (Signed)
Pulmonary Critical Care Medicine [place]   PULMONARY CRITICAL CARE SERVICE  PROGRESS NOTE  Date of Service: 03/14/2019  Tavien Chestnut  VOZ:[id number]  DOB: [birth year]   DOA: 02/17/2019  Referring Physician: Merton Border, MD  HPI: Luke Clayton is a 63 y.o. male seen for follow up of Acute on Chronic Respiratory Failure.  Patient currently is on T collar has been on 20% FiO2 using PMV good saturations  Medications: Reviewed on Rounds  Physical Exam:  Vitals: Temperature 98.1 pulse 94 respiratory rate 30 blood pressure 145/90 saturations 98%  Ventilator Settings off the ventilator right now on T collar  . General: Comfortable at this time . Eyes: Grossly normal lids, irises & conjunctiva . ENT: grossly tongue is normal . Neck: no obvious mass . Cardiovascular: S1 S2 normal no gallop . Respiratory: No rhonchi no rales are noted at this time . Abdomen: soft . Skin: no rash seen on limited exam . Musculoskeletal: not rigid . Psychiatric:unable to assess . Neurologic: no seizure no involuntary movements         Lab Data:   Basic Metabolic Panel: Recent Labs  Lab 03/09/19 0700 03/13/19 0559  NA 138 134*  K 3.7 4.2  CL 103 97*  CO2 26 30  GLUCOSE 168* 173*  BUN 16 17  CREATININE 0.89 0.92  CALCIUM 8.8* 8.9  MG 2.0 2.0  PHOS 3.2  --     ABG: No results for input(s): PHART, PCO2ART, PO2ART, HCO3, O2SAT in the last 168 hours.  Liver Function Tests: No results for input(s): AST, ALT, ALKPHOS, BILITOT, PROT, ALBUMIN in the last 168 hours. No results for input(s): LIPASE, AMYLASE in the last 168 hours. No results for input(s): AMMONIA in the last 168 hours.  CBC: Recent Labs  Lab 03/09/19 0700 03/13/19 0559  WBC 4.9 5.2  HGB 9.5* 9.4*  HCT 31.4* 30.4*  MCV 91.0 88.9  PLT 153 184    Cardiac Enzymes: No results for input(s): CKTOTAL, CKMB, CKMBINDEX, TROPONINI in the last 168 hours.  BNP (last 3 results) No results for input(s): BNP in  the last 8760 hours.  ProBNP (last 3 results) No results for input(s): PROBNP in the last 8760 hours.  Radiological Exams: No results found.  Assessment/Plan Active Problems:   Acute on chronic respiratory failure with hypoxia (HCC)   Traumatic subarachnoid hemorrhage with loss of consciousness (HCC)   Lobar pneumonia, unspecified organism ([place])   Multiple closed fractures of cervical vertebrae (HCC)   Traumatic brain injury with loss of consciousness of one hour or more ([place])   1. Acute on chronic respiratory failure with hypoxia we will continue T collar trials titrate oxygen continue pulmonary toilet 2. Traumatic subarachnoid hemorrhage unchanged 3. Lobar pneumonia unspecified clinically is improving 4. Multiple fractures supportive care 5. Traumatic brain injury unchanged we will continue with supportive care   I have personally seen and evaluated the patient, evaluated laboratory and imaging results, formulated the assessment and plan and placed orders. The Patient requires high complexity decision making for assessment and support.  Case was discussed on Rounds with the Respiratory Therapy Staff  Allyne Gee, MD [facility] Pulmonary Critical Care Medicine Sleep Medicine

## 2019-03-15 DIAGNOSIS — J181 Lobar pneumonia, unspecified organism: Secondary | ICD-10-CM | POA: Diagnosis not present

## 2019-03-15 DIAGNOSIS — S069X9A Unspecified intracranial injury with loss of consciousness of unspecified duration, initial encounter: Secondary | ICD-10-CM | POA: Diagnosis not present

## 2019-03-15 DIAGNOSIS — S129XXS Fracture of neck, unspecified, sequela: Secondary | ICD-10-CM | POA: Diagnosis not present

## 2019-03-15 DIAGNOSIS — J9621 Acute and chronic respiratory failure with hypoxia: Secondary | ICD-10-CM | POA: Diagnosis not present
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
## 2019-03-16 DIAGNOSIS — J9621 Acute and chronic respiratory failure with hypoxia: Secondary | ICD-10-CM | POA: Diagnosis not present

## 2019-03-16 DIAGNOSIS — J181 Lobar pneumonia, unspecified organism: Secondary | ICD-10-CM | POA: Diagnosis not present

## 2019-03-16 DIAGNOSIS — S069X9A Unspecified intracranial injury with loss of consciousness of unspecified duration, initial encounter: Secondary | ICD-10-CM | POA: Diagnosis not present

## 2019-03-16 DIAGNOSIS — S129XXS Fracture of neck, unspecified, sequela: Secondary | ICD-10-CM | POA: Diagnosis not present

## 2019-03-16 LAB — CBC
HCT: 25.7 % — ABNORMAL LOW (ref 39.0–52.0)
Hemoglobin: 8.3 g/dL — ABNORMAL LOW (ref 13.0–17.0)
MCH: 27.7 pg (ref 26.0–34.0)
MCHC: 32.3 g/dL (ref 30.0–36.0)
MCV: 85.7 fL (ref 80.0–100.0)
Platelets: 180 K/uL (ref 150–400)
RBC: 3 MIL/uL — ABNORMAL LOW (ref 4.22–5.81)
RDW: 18.6 % — ABNORMAL HIGH (ref 11.5–15.5)
WBC: 5 K/uL (ref 4.0–10.5)
nRBC: 0 % (ref 0.0–0.2)

## 2019-03-16 LAB — BASIC METABOLIC PANEL
Anion gap: 11 (ref 5–15)
BUN: 16 mg/dL (ref 8–23)
CO2: 26 mmol/L (ref 22–32)
Calcium: 8.4 mg/dL — ABNORMAL LOW (ref 8.9–10.3)
Chloride: 96 mmol/L — ABNORMAL LOW (ref 98–111)
Creatinine, Ser: 0.54 mg/dL — ABNORMAL LOW (ref 0.61–1.24)
GFR calc Af Amer: >60 mL/min (ref >60)
GFR calc non Af Amer: >60 mL/min (ref >60)
Glucose, Bld: 164 mg/dL — ABNORMAL HIGH (ref 70–99)
Potassium: 4.3 mmol/L (ref 3.5–5.1)
Sodium: 133 mmol/L — ABNORMAL LOW (ref 135–145)

## 2019-03-16 LAB — MAGNESIUM: Magnesium: 1.7 mg/dL (ref 1.7–2.4)
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
## 2019-03-17 ENCOUNTER — Other Ambulatory Visit (HOSPITAL_COMMUNITY): Payer: BC Managed Care – PPO

## 2019-03-17 DIAGNOSIS — J181 Lobar pneumonia, unspecified organism: Secondary | ICD-10-CM | POA: Diagnosis not present

## 2019-03-17 DIAGNOSIS — J9621 Acute and chronic respiratory failure with hypoxia: Secondary | ICD-10-CM | POA: Diagnosis not present

## 2019-03-17 DIAGNOSIS — S069X9A Unspecified intracranial injury with loss of consciousness of unspecified duration, initial encounter: Secondary | ICD-10-CM | POA: Diagnosis not present

## 2019-03-17 DIAGNOSIS — S129XXS Fracture of neck, unspecified, sequela: Secondary | ICD-10-CM | POA: Diagnosis not present

## 2019-03-17 LAB — SODIUM: Sodium: 136 mmol/L (ref 135–145)

## 2019-03-17 LAB — MAGNESIUM: Magnesium: 1.9 mg/dL (ref 1.7–2.4)

## 2019-03-17 NOTE — Progress Notes (Addendum)
Pulmonary Critical Care Medicine [place]   PULMONARY CRITICAL CARE SERVICE  PROGRESS NOTE  Date of Service: 03/17/2019  Quinton Voth  KXF:[id number]  DOB: 08-09-1955   DOA: 02/17/2019  Referring Physician: Merton Border, MD  HPI: Luke Clayton is a 63 y.o. male seen for follow up of Acute on Chronic Respiratory Failure.  Patient continues on aerosol trach collar 28% O2 using PMV with no difficulty.  Medications: Reviewed on Rounds  Physical Exam:  Vitals: Pulse 96 respirations 20 BP 140/66 O2 sat 98% temp 90.9  Ventilator Settings ATC 28%  . General: Comfortable at this time . Eyes: Grossly normal lids, irises & conjunctiva . ENT: grossly tongue is normal . Neck: no obvious mass . Cardiovascular: S1 S2 normal no gallop . Respiratory: No rales or rhonchi noted . Abdomen: soft . Skin: no rash seen on limited exam . Musculoskeletal: not rigid . Psychiatric:unable to assess . Neurologic: no seizure no involuntary movements         Lab Data:   Basic Metabolic Panel: Recent Labs  Lab 03/13/19 0559 03/16/19 0547 03/17/19 0636  NA 134* 133* 136  K 4.2 4.3  --   CL 97* 96*  --   CO2 30 26  --   GLUCOSE 173* 164*  --   BUN 17 16  --   CREATININE 0.92 0.54*  --   CALCIUM 8.9 8.4*  --   MG 2.0 1.7 1.9    ABG: No results for input(s): PHART, PCO2ART, PO2ART, HCO3, O2SAT in the last 168 hours.  Liver Function Tests: No results for input(s): AST, ALT, ALKPHOS, BILITOT, PROT, ALBUMIN in the last 168 hours. No results for input(s): LIPASE, AMYLASE in the last 168 hours. No results for input(s): AMMONIA in the last 168 hours.  CBC: Recent Labs  Lab 03/13/19 0559 03/16/19 0547  WBC 5.2 5.0  HGB 9.4* 8.3*  HCT 30.4* 25.7*  MCV 88.9 85.7  PLT 184 180    Cardiac Enzymes: No results for input(s): CKTOTAL, CKMB, CKMBINDEX, TROPONINI in the last 168 hours.  BNP (last 3 results) No results for input(s): BNP in the last 8760 hours.  ProBNP  (last 3 results) No results for input(s): PROBNP in the last 8760 hours.  Radiological Exams: Dg Chest Port 1 View  Result Date: 03/17/2019 CLINICAL DATA:  Fever/pneumonia. EXAM: PORTABLE CHEST 1 VIEW COMPARISON:  02/28/2019 and CT abdomen 02/27/2019 FINDINGS: Tracheostomy tube unchanged. Lungs are adequately inflated with stable hazy left retrocardiac density likely atelectasis as seen on previous CT. No definite effusion or pneumothorax. Cardiomediastinal silhouette and remainder of the exam is unchanged. IMPRESSION: Stable hazy left retrocardiac opacification likely atelectasis. Electronically Signed   By: Marin Olp M.D.   On: 03/17/2019 09:23    Assessment/Plan Active Problems:   Acute on chronic respiratory failure with hypoxia (HCC)   Traumatic subarachnoid hemorrhage with loss of consciousness (HCC)   Lobar pneumonia, unspecified organism ([place])   Multiple closed fractures of cervical vertebrae (HCC)   Traumatic brain injury with loss of consciousness of one hour or more ([place])   1. Acute on chronic respiratory failure with hypoxia we will continue with the T collar trials titrate oxygen continue pulmonary toilet continue use the PMV  2. Traumatic subarachnoid hemorrhage patient is unchanged we will continue with supportive care 3. Lobar pneumonia treated continue to monitor. 4. Multiple trauma with close cervical fractures showing evidence of improvement 5. Traumatic brain injury unchanged we will continue with supportive care  I have personally seen and evaluated the patient, evaluated laboratory and imaging results, formulated the assessment and plan and placed orders. The Patient requires high complexity decision making for assessment and support.  Case was discussed on Rounds with the Respiratory Therapy Staff  Allyne Gee, MD [facility] Pulmonary Critical Care Medicine Sleep Medicine

## 2019-03-18 DIAGNOSIS — S069X9A Unspecified intracranial injury with loss of consciousness of unspecified duration, initial encounter: Secondary | ICD-10-CM | POA: Diagnosis not present

## 2019-03-18 DIAGNOSIS — S129XXS Fracture of neck, unspecified, sequela: Secondary | ICD-10-CM | POA: Diagnosis not present

## 2019-03-18 DIAGNOSIS — J9621 Acute and chronic respiratory failure with hypoxia: Secondary | ICD-10-CM | POA: Diagnosis not present

## 2019-03-18 DIAGNOSIS — J181 Lobar pneumonia, unspecified organism: Secondary | ICD-10-CM | POA: Diagnosis not present

## 2019-03-18 LAB — BASIC METABOLIC PANEL
Anion gap: 10 (ref 5–15)
BUN: 12 mg/dL (ref 8–23)
CO2: 30 mmol/L (ref 22–32)
Calcium: 8.5 mg/dL — ABNORMAL LOW (ref 8.9–10.3)
Chloride: 96 mmol/L — ABNORMAL LOW (ref 98–111)
Creatinine, Ser: 0.58 mg/dL — ABNORMAL LOW (ref 0.61–1.24)
GFR calc Af Amer: >60 mL/min (ref >60)
GFR calc non Af Amer: >60 mL/min (ref >60)
Glucose, Bld: 152 mg/dL — ABNORMAL HIGH (ref 70–99)
Potassium: 3.4 mmol/L — ABNORMAL LOW (ref 3.5–5.1)
Sodium: 136 mmol/L (ref 135–145)

## 2019-03-18 LAB — CBC
HCT: 26 % — ABNORMAL LOW (ref 39.0–52.0)
Hemoglobin: 8.3 g/dL — ABNORMAL LOW (ref 13.0–17.0)
MCH: 27.6 pg (ref 26.0–34.0)
MCHC: 31.9 g/dL (ref 30.0–36.0)
MCV: 86.4 fL (ref 80.0–100.0)
Platelets: 187 10*3/uL (ref 150–400)
RBC: 3.01 MIL/uL — ABNORMAL LOW (ref 4.22–5.81)
RDW: 18.1 % — ABNORMAL HIGH (ref 11.5–15.5)
WBC: 4.2 10*3/uL (ref 4.0–10.5)
nRBC: 0 % (ref 0.0–0.2)

## 2019-03-18 NOTE — Progress Notes (Addendum)
Pulmonary Critical Care Medicine [place]   PULMONARY CRITICAL CARE SERVICE  PROGRESS NOTE  Date of Service: 03/18/2019  Luke Clayton  [id number]  DOB: [birth year]   DOA: 02/17/2019  Referring Physician: Merton Border, MD  HPI: Luke Clayton is a 63 y.o. male seen for follow up of Acute on Chronic Respiratory Failure.  Patient continues to 28% aerosol trach collar using PMV with no difficulty satting well with no distress.  Medications: Reviewed on Rounds  Physical Exam:  Vitals: Pulse 96 respirations 20 BP 114/66 O2 sat 98% temp 98.9  Ventilator Settings ATC 28%  . General: Comfortable at this time . Eyes: Grossly normal lids, irises & conjunctiva . ENT: grossly tongue is normal . Neck: no obvious mass . Cardiovascular: S1 S2 normal no gallop . Respiratory: No rales or rhonchi noted . Abdomen: soft . Skin: no rash seen on limited exam . Musculoskeletal: not rigid . Psychiatric:unable to assess . Neurologic: no seizure no involuntary movements         Lab Data:   Basic Metabolic Panel: Recent Labs  Lab 03/13/19 0559 03/16/19 0547 03/17/19 0636 03/18/19 0713  NA 134* 133* 136 136  K 4.2 4.3  --  3.4*  CL 97* 96*  --  96*  CO2 30 26  --  30  GLUCOSE 173* 164*  --  152*  BUN 17 16  --  12  CREATININE 0.92 0.54*  --  0.58*  CALCIUM 8.9 8.4*  --  8.5*  MG 2.0 1.7 1.9  --     ABG: No results for input(s): PHART, PCO2ART, PO2ART, HCO3, O2SAT in the last 168 hours.  Liver Function Tests: No results for input(s): AST, ALT, ALKPHOS, BILITOT, PROT, ALBUMIN in the last 168 hours. No results for input(s): LIPASE, AMYLASE in the last 168 hours. No results for input(s): AMMONIA in the last 168 hours.  CBC: Recent Labs  Lab 03/13/19 0559 03/16/19 0547 03/18/19 0713  WBC 5.2 5.0 4.2  HGB 9.4* 8.3* 8.3*  HCT 30.4* 25.7* 26.0*  MCV 88.9 85.7 86.4  PLT 184 180 187    Cardiac Enzymes: No results for input(s): CKTOTAL, CKMB, CKMBINDEX,  TROPONINI in the last 168 hours.  BNP (last 3 results) No results for input(s): BNP in the last 8760 hours.  ProBNP (last 3 results) No results for input(s): PROBNP in the last 8760 hours.  Radiological Exams: Dg Chest Port 1 View  Result Date: 03/17/2019 CLINICAL DATA:  Fever/pneumonia. EXAM: PORTABLE CHEST 1 VIEW COMPARISON:  02/28/2019 and CT abdomen 02/27/2019 FINDINGS: Tracheostomy tube unchanged. Lungs are adequately inflated with stable hazy left retrocardiac density likely atelectasis as seen on previous CT. No definite effusion or pneumothorax. Cardiomediastinal silhouette and remainder of the exam is unchanged. IMPRESSION: Stable hazy left retrocardiac opacification likely atelectasis. Electronically Signed   By: Marin Olp M.D.   On: 03/17/2019 09:23    Assessment/Plan Active Problems:   Acute on chronic respiratory failure with hypoxia (HCC)   Traumatic subarachnoid hemorrhage with loss of consciousness (HCC)   Lobar pneumonia, unspecified organism ([place])   Multiple closed fractures of cervical vertebrae (HCC)   Traumatic brain injury with loss of consciousness of one hour or more ([place])   1. Acute on chronic respiratory failure with hypoxia we will continue with the T collar trials titrate oxygen continue pulmonary toilet continue use the PMV  2. Traumatic subarachnoid hemorrhage patient is unchanged we will continue with supportive care 3. Lobar pneumonia treated continue to monitor.  4. Multiple trauma with close cervical fractures showing evidence of improvement 5. Traumatic brain injury unchanged we will continue with supportive care    I have personally seen and evaluated the patient, evaluated laboratory and imaging results, formulated the assessment and plan and placed orders. The Patient requires high complexity decision making for assessment and support.  Case was discussed on Rounds with the Respiratory Therapy Staff  Yevonne PaxSaadat A Khan, MD [facility] Pulmonary Critical  Care Medicine Sleep Medicine

## 2019-03-19 DIAGNOSIS — J181 Lobar pneumonia, unspecified organism: Secondary | ICD-10-CM | POA: Diagnosis not present

## 2019-03-19 DIAGNOSIS — S069X9A Unspecified intracranial injury with loss of consciousness of unspecified duration, initial encounter: Secondary | ICD-10-CM | POA: Diagnosis not present

## 2019-03-19 DIAGNOSIS — S129XXS Fracture of neck, unspecified, sequela: Secondary | ICD-10-CM | POA: Diagnosis not present

## 2019-03-19 DIAGNOSIS — J9621 Acute and chronic respiratory failure with hypoxia: Secondary | ICD-10-CM | POA: Diagnosis not present

## 2019-03-19 LAB — POTASSIUM: Potassium: 3.9 mmol/L (ref 3.5–5.1)

## 2019-03-19 NOTE — Progress Notes (Addendum)
Pulmonary Critical Care Medicine [place]   PULMONARY CRITICAL CARE SERVICE  PROGRESS NOTE  Date of Service: 03/19/2019  Miriam Kestler  [id number]  DOB: 11-23-1955   DOA: 02/17/2019  Referring Physician: Merton Border, MD  HPI: Laterrian Hevener is a 63 y.o. male seen for follow up of Acute on Chronic Respiratory Failure.  Patient continues on aerosol trach collar 28% FiO2 refusing PMV today has a moderate amount of secretions.  Medications: Reviewed on Rounds  Physical Exam:  Vitals: Pulse 97 respiration 32 BP 133/69 O2 sats 7% temp 98.6  Ventilator Settings ATC 28%  . General: Comfortable at this time . Eyes: Grossly normal lids, irises & conjunctiva . ENT: grossly tongue is normal . Neck: no obvious mass . Cardiovascular: S1 S2 normal no gallop . Respiratory: No rales or rhonchi noted . Abdomen: soft . Skin: no rash seen on limited exam . Musculoskeletal: not rigid . Psychiatric:unable to assess . Neurologic: no seizure no involuntary movements         Lab Data:   Basic Metabolic Panel: Recent Labs  Lab 03/13/19 0559 03/16/19 0547 03/17/19 0636 03/18/19 0713 03/19/19 0846  NA 134* 133* 136 136  --   K 4.2 4.3  --  3.4* 3.9  CL 97* 96*  --  96*  --   CO2 30 26  --  30  --   GLUCOSE 173* 164*  --  152*  --   BUN 17 16  --  12  --   CREATININE 0.92 0.54*  --  0.58*  --   CALCIUM 8.9 8.4*  --  8.5*  --   MG 2.0 1.7 1.9  --   --     ABG: No results for input(s): PHART, PCO2ART, PO2ART, HCO3, O2SAT in the last 168 hours.  Liver Function Tests: No results for input(s): AST, ALT, ALKPHOS, BILITOT, PROT, ALBUMIN in the last 168 hours. No results for input(s): LIPASE, AMYLASE in the last 168 hours. No results for input(s): AMMONIA in the last 168 hours.  CBC: Recent Labs  Lab 03/13/19 0559 03/16/19 0547 03/18/19 0713  WBC 5.2 5.0 4.2  HGB 9.4* 8.3* 8.3*  HCT 30.4* 25.7* 26.0*  MCV 88.9 85.7 86.4  PLT 184 180 187    Cardiac  Enzymes: No results for input(s): CKTOTAL, CKMB, CKMBINDEX, TROPONINI in the last 168 hours.  BNP (last 3 results) No results for input(s): BNP in the last 8760 hours.  ProBNP (last 3 results) No results for input(s): PROBNP in the last 8760 hours.  Radiological Exams: No results found.  Assessment/Plan Active Problems:   Acute on chronic respiratory failure with hypoxia (HCC)   Traumatic subarachnoid hemorrhage with loss of consciousness (HCC)   Lobar pneumonia, unspecified organism ([place])   Multiple closed fractures of cervical vertebrae (HCC)   Traumatic brain injury with loss of consciousness of one hour or more ([place])   1. Acute on chronic respiratory failure with hypoxia we will continue with the T collar trials titrate oxygen continue pulmonary toilet continue use the PMV  2. Traumatic subarachnoid hemorrhage patient is unchanged we will continue with supportive care 3. Lobar pneumonia treatedcontinue to monitor. 4. Multiple trauma with close cervical fractures showing evidence of improvement 5. Traumatic brain injury unchanged we will continue with supportive care   I have personally seen and evaluated the patient, evaluated laboratory and imaging results, formulated the assessment and plan and placed orders. The Patient requires high complexity decision making for assessment and  support.  Case was discussed on Rounds with the Respiratory Therapy Staff  Allyne Gee, MD [facility] Pulmonary Critical Care Medicine Sleep Medicine

## 2019-03-20 DIAGNOSIS — S069X9A Unspecified intracranial injury with loss of consciousness of unspecified duration, initial encounter: Secondary | ICD-10-CM | POA: Diagnosis not present

## 2019-03-20 DIAGNOSIS — J181 Lobar pneumonia, unspecified organism: Secondary | ICD-10-CM | POA: Diagnosis not present

## 2019-03-20 DIAGNOSIS — J9621 Acute and chronic respiratory failure with hypoxia: Secondary | ICD-10-CM | POA: Diagnosis not present

## 2019-03-20 DIAGNOSIS — S129XXS Fracture of neck, unspecified, sequela: Secondary | ICD-10-CM | POA: Diagnosis not present

## 2019-03-20 LAB — CBC
HCT: 27.7 % — ABNORMAL LOW (ref 39.0–52.0)
Hemoglobin: 8.8 g/dL — ABNORMAL LOW (ref 13.0–17.0)
MCH: 27.3 pg (ref 26.0–34.0)
MCHC: 31.8 g/dL (ref 30.0–36.0)
MCV: 86 fL (ref 80.0–100.0)
Platelets: 195 10*3/uL (ref 150–400)
RBC: 3.22 MIL/uL — ABNORMAL LOW (ref 4.22–5.81)
RDW: 17.4 % — ABNORMAL HIGH (ref 11.5–15.5)
WBC: 4.5 10*3/uL (ref 4.0–10.5)
nRBC: 0 % (ref 0.0–0.2)

## 2019-03-20 LAB — BASIC METABOLIC PANEL
Anion gap: 10 (ref 5–15)
BUN: 12 mg/dL (ref 8–23)
CO2: 29 mmol/L (ref 22–32)
Calcium: 8.8 mg/dL — ABNORMAL LOW (ref 8.9–10.3)
Chloride: 97 mmol/L — ABNORMAL LOW (ref 98–111)
Creatinine, Ser: 0.47 mg/dL — ABNORMAL LOW (ref 0.61–1.24)
GFR calc Af Amer: >60 mL/min (ref >60)
GFR calc non Af Amer: >60 mL/min (ref >60)
Glucose, Bld: 157 mg/dL — ABNORMAL HIGH (ref 70–99)
Potassium: 3.8 mmol/L (ref 3.5–5.1)
Sodium: 136 mmol/L (ref 135–145)

## 2019-03-20 LAB — CULTURE, RESPIRATORY W GRAM STAIN

## 2019-03-20 LAB — MAGNESIUM: Magnesium: 1.8 mg/dL (ref 1.7–2.4)

## 2019-03-20 LAB — PHOSPHORUS: Phosphorus: 4.3 mg/dL (ref 2.5–4.6)

## 2019-03-20 NOTE — Progress Notes (Addendum)
Pulmonary Critical Care Medicine [place]   PULMONARY CRITICAL CARE SERVICE  PROGRESS NOTE  Date of Service: 03/20/2019  Luke Clayton  TKW:[id number]  DOB: [birth year]   DOA: 02/17/2019  Referring Physician: Merton Border, MD  HPI: Luke Clayton is a 63 y.o. male seen for follow up of Acute on Chronic Respiratory Failure.  Patient continues on aerosol trach collar 28% FiO2 satting well with no fever or distress.  Using PMV with no difficulty.  Medications: Reviewed on Rounds  Physical Exam:  Vitals: Pulse 87 respirations 24 BP 121/74 O2 sat 100% temp 98.2  Ventilator Settings ATC 28%  . General: Comfortable at this time . Eyes: Grossly normal lids, irises & conjunctiva . ENT: grossly tongue is normal . Neck: no obvious mass . Cardiovascular: S1 S2 normal no gallop . Respiratory: No rales or rhonchi noted . Abdomen: soft . Skin: no rash seen on limited exam . Musculoskeletal: not rigid . Psychiatric:unable to assess . Neurologic: no seizure no involuntary movements         Lab Data:   Basic Metabolic Panel: Recent Labs  Lab 03/16/19 0547 03/17/19 0636 03/18/19 0713 03/19/19 0846 03/20/19 0611  NA 133* 136 136  --  136  K 4.3  --  3.4* 3.9 3.8  CL 96*  --  96*  --  97*  CO2 26  --  30  --  29  GLUCOSE 164*  --  152*  --  157*  BUN 16  --  12  --  12  CREATININE 0.54*  --  0.58*  --  0.47*  CALCIUM 8.4*  --  8.5*  --  8.8*  MG 1.7 1.9  --   --  1.8  PHOS  --   --   --   --  4.3    ABG: No results for input(s): PHART, PCO2ART, PO2ART, HCO3, O2SAT in the last 168 hours.  Liver Function Tests: No results for input(s): AST, ALT, ALKPHOS, BILITOT, PROT, ALBUMIN in the last 168 hours. No results for input(s): LIPASE, AMYLASE in the last 168 hours. No results for input(s): AMMONIA in the last 168 hours.  CBC: Recent Labs  Lab 03/16/19 0547 03/18/19 0713 03/20/19 0611  WBC 5.0 4.2 4.5  HGB 8.3* 8.3* 8.8*  HCT 25.7* 26.0* 27.7*  MCV 85.7  86.4 86.0  PLT 180 187 195    Cardiac Enzymes: No results for input(s): CKTOTAL, CKMB, CKMBINDEX, TROPONINI in the last 168 hours.  BNP (last 3 results) No results for input(s): BNP in the last 8760 hours.  ProBNP (last 3 results) No results for input(s): PROBNP in the last 8760 hours.  Radiological Exams: No results found.  Assessment/Plan Active Problems:   Acute on chronic respiratory failure with hypoxia (HCC)   Traumatic subarachnoid hemorrhage with loss of consciousness (HCC)   Lobar pneumonia, unspecified organism ([place])   Multiple closed fractures of cervical vertebrae (HCC)   Traumatic brain injury with loss of consciousness of one hour or more ([place])   1. Acute on chronic respiratory failure with hypoxia we will continue with the T collar trials titrate oxygen continue pulmonary toilet continue use the PMV  2. Traumatic subarachnoid hemorrhage patient is unchanged we will continue with supportive care 3. Lobar pneumonia treatedcontinue to monitor. 4. Multiple trauma with close cervical fractures showing evidence of improvement 5. Traumatic brain injury unchanged we will continue with supportive care   I have personally seen and evaluated the patient, evaluated laboratory and imaging  results, formulated the assessment and plan and placed orders. The Patient requires high complexity decision making for assessment and support.  Case was discussed on Rounds with the Respiratory Therapy Staff  Allyne Gee, MD [facility] Pulmonary Critical Care Medicine Sleep Medicine

## 2019-03-21 DIAGNOSIS — J181 Lobar pneumonia, unspecified organism: Secondary | ICD-10-CM | POA: Diagnosis not present

## 2019-03-21 DIAGNOSIS — J9621 Acute and chronic respiratory failure with hypoxia: Secondary | ICD-10-CM | POA: Diagnosis not present

## 2019-03-21 DIAGNOSIS — S129XXS Fracture of neck, unspecified, sequela: Secondary | ICD-10-CM | POA: Diagnosis not present

## 2019-03-21 DIAGNOSIS — S069X9A Unspecified intracranial injury with loss of consciousness of unspecified duration, initial encounter: Secondary | ICD-10-CM | POA: Diagnosis not present

## 2019-03-21 NOTE — Progress Notes (Signed)
Pulmonary Critical Care Medicine [place]   PULMONARY CRITICAL CARE SERVICE  PROGRESS NOTE  Date of Service: 03/21/2019  Luke Clayton  EUM:[id number]  DOB: 08/14/1955   DOA: 02/17/2019  Referring Physician: Merton Border, MD  HPI: Luke Clayton is a 63 y.o. male seen for follow up of Acute on Chronic Respiratory Failure.  Patient had the trach changed by ENT is actually doing quite well.  Medications: Reviewed on Rounds  Physical Exam:  Vitals: Temperature 98.6 pulse 92 respiratory 28 blood pressure 108/68 saturations 98%  Ventilator Settings currently is on T collar with PMV  . General: Comfortable at this time . Eyes: Grossly normal lids, irises & conjunctiva . ENT: grossly tongue is normal . Neck: no obvious mass . Cardiovascular: S1 S2 normal no gallop . Respiratory: No rhonchi coarse breath sounds are noted . Abdomen: soft . Skin: no rash seen on limited exam . Musculoskeletal: not rigid . Psychiatric:unable to assess . Neurologic: no seizure no involuntary movements         Lab Data:   Basic Metabolic Panel: Recent Labs  Lab 03/16/19 0547 03/17/19 0636 03/18/19 0713 03/19/19 0846 03/20/19 0611  NA 133* 136 136  --  136  K 4.3  --  3.4* 3.9 3.8  CL 96*  --  96*  --  97*  CO2 26  --  30  --  29  GLUCOSE 164*  --  152*  --  157*  BUN 16  --  12  --  12  CREATININE 0.54*  --  0.58*  --  0.47*  CALCIUM 8.4*  --  8.5*  --  8.8*  MG 1.7 1.9  --   --  1.8  PHOS  --   --   --   --  4.3    ABG: No results for input(s): PHART, PCO2ART, PO2ART, HCO3, O2SAT in the last 168 hours.  Liver Function Tests: No results for input(s): AST, ALT, ALKPHOS, BILITOT, PROT, ALBUMIN in the last 168 hours. No results for input(s): LIPASE, AMYLASE in the last 168 hours. No results for input(s): AMMONIA in the last 168 hours.  CBC: Recent Labs  Lab 03/16/19 0547 03/18/19 0713 03/20/19 0611  WBC 5.0 4.2 4.5  HGB 8.3* 8.3* 8.8*  HCT 25.7* 26.0* 27.7*   MCV 85.7 86.4 86.0  PLT 180 187 195    Cardiac Enzymes: No results for input(s): CKTOTAL, CKMB, CKMBINDEX, TROPONINI in the last 168 hours.  BNP (last 3 results) No results for input(s): BNP in the last 8760 hours.  ProBNP (last 3 results) No results for input(s): PROBNP in the last 8760 hours.  Radiological Exams: No results found.  Assessment/Plan Active Problems:   Acute on chronic respiratory failure with hypoxia (HCC)   Traumatic subarachnoid hemorrhage with loss of consciousness (HCC)   Lobar pneumonia, unspecified organism ([place])   Multiple closed fractures of cervical vertebrae (HCC)   Traumatic brain injury with loss of consciousness of one hour or more ([place])   1. Acute on chronic respiratory failure with hypoxia we will proceed to capping trials patient is doing well with the new trach 2. Traumatic subarachnoid hemorrhage unchanged we will continue present management 3. Lobar pneumonia treated we will continue supportive care 4. Multiple fractures improving based on the last CT scan 5. Traumatic brain injury slow to improve we will continue with supportive care   I have personally seen and evaluated the patient, evaluated laboratory and imaging results, formulated the assessment and  plan and placed orders. The Patient requires high complexity decision making for assessment and support.  Case was discussed on Rounds with the Respiratory Therapy Staff  Allyne Gee, MD [facility] Pulmonary Critical Care Medicine Sleep Medicine

## 2019-03-22 DIAGNOSIS — J181 Lobar pneumonia, unspecified organism: Secondary | ICD-10-CM | POA: Diagnosis not present

## 2019-03-22 DIAGNOSIS — S069X9A Unspecified intracranial injury with loss of consciousness of unspecified duration, initial encounter: Secondary | ICD-10-CM | POA: Diagnosis not present

## 2019-03-22 DIAGNOSIS — S129XXS Fracture of neck, unspecified, sequela: Secondary | ICD-10-CM | POA: Diagnosis not present

## 2019-03-22 DIAGNOSIS — J9621 Acute and chronic respiratory failure with hypoxia: Secondary | ICD-10-CM | POA: Diagnosis not present

## 2019-03-22 LAB — CBC
HCT: 26.9 % — ABNORMAL LOW (ref 39.0–52.0)
Hemoglobin: 8.2 g/dL — ABNORMAL LOW (ref 13.0–17.0)
MCH: 26.5 pg (ref 26.0–34.0)
MCHC: 30.5 g/dL (ref 30.0–36.0)
MCV: 86.8 fL (ref 80.0–100.0)
Platelets: 228 10*3/uL (ref 150–400)
RBC: 3.1 MIL/uL — ABNORMAL LOW (ref 4.22–5.81)
RDW: 17.6 % — ABNORMAL HIGH (ref 11.5–15.5)
WBC: 4.1 10*3/uL (ref 4.0–10.5)
nRBC: 0 % (ref 0.0–0.2)

## 2019-03-22 LAB — BASIC METABOLIC PANEL
Anion gap: 10 (ref 5–15)
BUN: 13 mg/dL (ref 8–23)
CO2: 28 mmol/L (ref 22–32)
Calcium: 8.6 mg/dL — ABNORMAL LOW (ref 8.9–10.3)
Chloride: 97 mmol/L — ABNORMAL LOW (ref 98–111)
Creatinine, Ser: 0.54 mg/dL — ABNORMAL LOW (ref 0.61–1.24)
GFR calc Af Amer: >60 mL/min (ref >60)
GFR calc non Af Amer: >60 mL/min (ref >60)
Glucose, Bld: 192 mg/dL — ABNORMAL HIGH (ref 70–99)
Potassium: 3.7 mmol/L (ref 3.5–5.1)
Sodium: 135 mmol/L (ref 135–145)

## 2019-03-22 LAB — PHOSPHORUS: Phosphorus: 4.2 mg/dL (ref 2.5–4.6)

## 2019-03-22 LAB — MAGNESIUM: Magnesium: 1.8 mg/dL (ref 1.7–2.4)

## 2019-03-22 NOTE — Progress Notes (Addendum)
Pulmonary Critical Care Medicine [place]   PULMONARY CRITICAL CARE SERVICE  PROGRESS NOTE  Date of Service: 03/22/2019  Mattthew Ziomek  EPP:[id number]  DOB: 1955-08-29   DOA: 02/17/2019  Referring Physician: Merton Border, MD  HPI: Pacey Altizer is a 63 y.o. male seen for follow up of Acute on Chronic Respiratory Failure.  Patient mains capped on room air at this time satting well.  Medications: Reviewed on Rounds  Physical Exam:  Vitals: Pulse 90 respirations 15 BP 91/65 O2 sat 95% temp 93.9  Ventilator Settings room air  . General: Comfortable at this time . Eyes: Grossly normal lids, irises & conjunctiva . ENT: grossly tongue is normal . Neck: no obvious mass . Cardiovascular: S1 S2 normal no gallop . Respiratory: No rales or rhonchi noted . Abdomen: soft . Skin: no rash seen on limited exam . Musculoskeletal: not rigid . Psychiatric:unable to assess . Neurologic: no seizure no involuntary movements         Lab Data:   Basic Metabolic Panel: Recent Labs  Lab 03/16/19 0547 03/17/19 0636 03/18/19 0713 03/19/19 0846 03/20/19 0611 03/22/19 1056  NA 133* 136 136  --  136 135  K 4.3  --  3.4* 3.9 3.8 3.7  CL 96*  --  96*  --  97* 97*  CO2 26  --  30  --  29 28  GLUCOSE 164*  --  152*  --  157* 192*  BUN 16  --  12  --  12 13  CREATININE 0.54*  --  0.58*  --  0.47* 0.54*  CALCIUM 8.4*  --  8.5*  --  8.8* 8.6*  MG 1.7 1.9  --   --  1.8 1.8  PHOS  --   --   --   --  4.3 4.2    ABG: No results for input(s): PHART, PCO2ART, PO2ART, HCO3, O2SAT in the last 168 hours.  Liver Function Tests: No results for input(s): AST, ALT, ALKPHOS, BILITOT, PROT, ALBUMIN in the last 168 hours. No results for input(s): LIPASE, AMYLASE in the last 168 hours. No results for input(s): AMMONIA in the last 168 hours.  CBC: Recent Labs  Lab 03/16/19 0547 03/18/19 0713 03/20/19 0611 03/22/19 1056  WBC 5.0 4.2 4.5 4.1  HGB 8.3* 8.3* 8.8* 8.2*  HCT 25.7*  26.0* 27.7* 26.9*  MCV 85.7 86.4 86.0 86.8  PLT 180 187 195 228    Cardiac Enzymes: No results for input(s): CKTOTAL, CKMB, CKMBINDEX, TROPONINI in the last 168 hours.  BNP (last 3 results) No results for input(s): BNP in the last 8760 hours.  ProBNP (last 3 results) No results for input(s): PROBNP in the last 8760 hours.  Radiological Exams: No results found.  Assessment/Plan Active Problems:   Acute on chronic respiratory failure with hypoxia (HCC)   Traumatic subarachnoid hemorrhage with loss of consciousness (HCC)   Lobar pneumonia, unspecified organism ([place])   Multiple closed fractures of cervical vertebrae (HCC)   Traumatic brain injury with loss of consciousness of one hour or more ([place])   1. Acute on chronic respiratory failure with hypoxia we will proceed to capping trials patient is doing well with the new trach 2. Traumatic subarachnoid hemorrhage unchanged we will continue present management 3. Lobar pneumonia treated we will continue supportive care 4. Multiple fractures improving based on the last CT scan 5. Traumatic brain injury slow to improve we will continue with supportive care   I have personally seen and evaluated  the patient, evaluated laboratory and imaging results, formulated the assessment and plan and placed orders. The Patient requires high complexity decision making for assessment and support.  Case was discussed on Rounds with the Respiratory Therapy Staff  Allyne Gee, MD [facility] Pulmonary Critical Care Medicine Sleep Medicine

## 2019-03-23 DIAGNOSIS — S069X9A Unspecified intracranial injury with loss of consciousness of unspecified duration, initial encounter: Secondary | ICD-10-CM | POA: Diagnosis not present

## 2019-03-23 DIAGNOSIS — S129XXS Fracture of neck, unspecified, sequela: Secondary | ICD-10-CM | POA: Diagnosis not present

## 2019-03-23 DIAGNOSIS — J181 Lobar pneumonia, unspecified organism: Secondary | ICD-10-CM | POA: Diagnosis not present

## 2019-03-23 DIAGNOSIS — J9621 Acute and chronic respiratory failure with hypoxia: Secondary | ICD-10-CM | POA: Diagnosis not present

## 2019-03-23 NOTE — Progress Notes (Addendum)
Pulmonary Critical Care Medicine [place]   PULMONARY CRITICAL CARE SERVICE  PROGRESS NOTE  Date of Service: 03/23/2019  Luke Clayton  ASN:[id number]  DOB: 1956/01/02   DOA: 02/17/2019  Referring Physician: Merton Border, MD  HPI: Luke Clayton is a 63 y.o. male seen for follow up of Acute on Chronic Respiratory Failure.  Patient remains capped for 48 hours satting well with no distress.  Medications: Reviewed on Rounds  Physical Exam:  Vitals: Pulse 125 respirations 20 BP 125/95 O2 sat 94% temp 99.3  Ventilator Settings room air  . General: Comfortable at this time . Eyes: Grossly normal lids, irises & conjunctiva . ENT: grossly tongue is normal . Neck: no obvious mass . Cardiovascular: S1 S2 normal no gallop . Respiratory: No rales or rhonchi noted . Abdomen: soft . Skin: no rash seen on limited exam . Musculoskeletal: not rigid . Psychiatric:unable to assess . Neurologic: no seizure no involuntary movements         Lab Data:   Basic Metabolic Panel: Recent Labs  Lab 03/17/19 0636 03/18/19 0713 03/19/19 0846 03/20/19 0611 03/22/19 1056  NA 136 136  --  136 135  K  --  3.4* 3.9 3.8 3.7  CL  --  96*  --  97* 97*  CO2  --  30  --  29 28  GLUCOSE  --  152*  --  157* 192*  BUN  --  12  --  12 13  CREATININE  --  0.58*  --  0.47* 0.54*  CALCIUM  --  8.5*  --  8.8* 8.6*  MG 1.9  --   --  1.8 1.8  PHOS  --   --   --  4.3 4.2    ABG: No results for input(s): PHART, PCO2ART, PO2ART, HCO3, O2SAT in the last 168 hours.  Liver Function Tests: No results for input(s): AST, ALT, ALKPHOS, BILITOT, PROT, ALBUMIN in the last 168 hours. No results for input(s): LIPASE, AMYLASE in the last 168 hours. No results for input(s): AMMONIA in the last 168 hours.  CBC: Recent Labs  Lab 03/18/19 0713 03/20/19 0611 03/22/19 1056  WBC 4.2 4.5 4.1  HGB 8.3* 8.8* 8.2*  HCT 26.0* 27.7* 26.9*  MCV 86.4 86.0 86.8  PLT 187 195 228    Cardiac Enzymes: No  results for input(s): CKTOTAL, CKMB, CKMBINDEX, TROPONINI in the last 168 hours.  BNP (last 3 results) No results for input(s): BNP in the last 8760 hours.  ProBNP (last 3 results) No results for input(s): PROBNP in the last 8760 hours.  Radiological Exams: No results found.  Assessment/Plan Active Problems:   Acute on chronic respiratory failure with hypoxia (HCC)   Traumatic subarachnoid hemorrhage with loss of consciousness (HCC)   Lobar pneumonia, unspecified organism ([place])   Multiple closed fractures of cervical vertebrae (HCC)   Traumatic brain injury with loss of consciousness of one hour or more ([place])   1. Acute on chronic respiratory failure with hypoxia continue capping trials at this time.  Continue aggressive pulmonary toilet and supportive measures. 2. Traumatic subarachnoid hemorrhage unchanged we will continue present management 3. Lobar pneumonia treated we will continue supportive care 4. Multiple fractures improving based on the last CT scan 5. Traumatic brain injury slow to improve we will continue with supportive care    I have personally seen and evaluated the patient, evaluated laboratory and imaging results, formulated the assessment and plan and placed orders. The Patient requires high complexity  decision making for assessment and support.  Case was discussed on Rounds with the Respiratory Therapy Staff  Allyne Gee, MD [facility] Pulmonary Critical Care Medicine Sleep Medicine

## 2019-03-24 LAB — CBC
HCT: 26.8 % — ABNORMAL LOW (ref 39.0–52.0)
Hemoglobin: 8.5 g/dL — ABNORMAL LOW (ref 13.0–17.0)
MCH: 27.3 pg (ref 26.0–34.0)
MCHC: 31.7 g/dL (ref 30.0–36.0)
MCV: 86.2 fL (ref 80.0–100.0)
Platelets: 209 10*3/uL (ref 150–400)
RBC: 3.11 MIL/uL — ABNORMAL LOW (ref 4.22–5.81)
RDW: 17.4 % — ABNORMAL HIGH (ref 11.5–15.5)
WBC: 4.7 10*3/uL (ref 4.0–10.5)
nRBC: 0 % (ref 0.0–0.2)

## 2019-03-24 LAB — RENAL FUNCTION PANEL
Albumin: 1.8 g/dL — ABNORMAL LOW (ref 3.5–5.0)
Anion gap: 12 (ref 5–15)
BUN: 17 mg/dL (ref 8–23)
CO2: 26 mmol/L (ref 22–32)
Calcium: 8.5 mg/dL — ABNORMAL LOW (ref 8.9–10.3)
Chloride: 96 mmol/L — ABNORMAL LOW (ref 98–111)
Creatinine, Ser: 0.7 mg/dL (ref 0.61–1.24)
GFR calc Af Amer: >60 mL/min (ref >60)
GFR calc non Af Amer: >60 mL/min (ref >60)
Glucose, Bld: 182 mg/dL — ABNORMAL HIGH (ref 70–99)
Phosphorus: 4.6 mg/dL (ref 2.5–4.6)
Potassium: 3.4 mmol/L — ABNORMAL LOW (ref 3.5–5.1)
Sodium: 134 mmol/L — ABNORMAL LOW (ref 135–145)

## 2019-03-24 LAB — URINALYSIS, ROUTINE W REFLEX MICROSCOPIC
Bilirubin Urine: NEGATIVE
Glucose, UA: NEGATIVE mg/dL
Hgb urine dipstick: NEGATIVE
Ketones, ur: NEGATIVE mg/dL
Leukocytes,Ua: NEGATIVE
Nitrite: NEGATIVE
Protein, ur: NEGATIVE mg/dL
Specific Gravity, Urine: 1.017 (ref 1.005–1.030)
pH: 7 (ref 5.0–8.0)

## 2019-03-24 LAB — MAGNESIUM: Magnesium: 1.8 mg/dL (ref 1.7–2.4)

## 2019-03-25 DIAGNOSIS — J181 Lobar pneumonia, unspecified organism: Secondary | ICD-10-CM | POA: Diagnosis not present

## 2019-03-25 DIAGNOSIS — S129XXS Fracture of neck, unspecified, sequela: Secondary | ICD-10-CM | POA: Diagnosis not present

## 2019-03-25 DIAGNOSIS — S069X9A Unspecified intracranial injury with loss of consciousness of unspecified duration, initial encounter: Secondary | ICD-10-CM | POA: Diagnosis not present

## 2019-03-25 DIAGNOSIS — J9621 Acute and chronic respiratory failure with hypoxia: Secondary | ICD-10-CM | POA: Diagnosis not present

## 2019-03-25 LAB — POTASSIUM: Potassium: 3.9 mmol/L (ref 3.5–5.1)

## 2019-03-25 NOTE — Progress Notes (Signed)
Pulmonary Critical Care [facility]   PULMONARY CRITICAL CARE SERVICE  PROGRESS NOTE  Date of Service: 03/25/2019  Luke Clayton  ONG:[id number]  DOB: 01/27/1956   DOA: 02/17/2019  Referring Physician: Carron CurieAli Hijazi, MD  HPI: Luke KocherLouis Ned is a 63 y.o. male seen for follow up of Acute on Chronic Respiratory Failure.  Patient is capping without any distress at this time appears to be comfortable however patient did have a fever noted up to 104 today  Medications: Reviewed on Rounds  Physical Exam:  Vitals: Temperature was up to 104 pulse 96 respiratory rate 13 blood pressure 89/52 saturations 100%  Ventilator Settings capping off the ventilator right now  . General: Comfortable at this time . Eyes: Grossly normal lids, irises & conjunctiva . ENT: grossly tongue is normal . Neck: no obvious mass . Cardiovascular: S1 S2 normal no gallop . Respiratory: No rhonchi no rales are noted . Abdomen: soft . Skin: no rash seen on limited exam . Musculoskeletal: not rigid . Psychiatric:unable to assess . Neurologic: no seizure no involuntary movements         Lab Data:   Basic Metabolic Panel: Recent Labs  Lab 03/19/19 0846 03/20/19 0611 03/22/19 1056 03/24/19 0641 03/25/19 1230  NA  --  136 135 134*  --   K 3.9 3.8 3.7 3.4* 3.9  CL  --  97* 97* 96*  --   CO2  --  29 28 26   --   GLUCOSE  --  157* 192* 182*  --   BUN  --  12 13 17   --   CREATININE  --  0.47* 0.54* 0.70  --   CALCIUM  --  8.8* 8.6* 8.5*  --   MG  --  1.8 1.8 1.8  --   PHOS  --  4.3 4.2 4.6  --     ABG: No results for input(s): PHART, PCO2ART, PO2ART, HCO3, O2SAT in the last 168 hours.  Liver Function Tests: Recent Labs  Lab 03/24/19 0641  ALBUMIN 1.8*   No results for input(s): LIPASE, AMYLASE in the last 168 hours. No results for input(s): AMMONIA in the last 168 hours.  CBC: Recent Labs  Lab 03/20/19 0611 03/22/19 1056 03/24/19 0641  WBC 4.5 4.1 4.7  HGB 8.8* 8.2*  8.5*  HCT 27.7* 26.9* 26.8*  MCV 86.0 86.8 86.2  PLT 195 228 209    Cardiac Enzymes: No results for input(s): CKTOTAL, CKMB, CKMBINDEX, TROPONINI in the last 168 hours.  BNP (last 3 results) No results for input(s): BNP in the last 8760 hours.  ProBNP (last 3 results) No results for input(s): PROBNP in the last 8760 hours.  Radiological Exams: No results found.  Assessment/Plan Active Problems:   Acute on chronic respiratory failure with hypoxia (HCC)   Traumatic subarachnoid hemorrhage with loss of consciousness (HCC)   Lobar pneumonia, unspecified organism (HCC)   Multiple closed fractures of cervical vertebrae (HCC)   Traumatic brain injury with loss of consciousness of one hour or more (HCC)   1. Acute on chronic respiratory failure with hypoxia we will continue with capping for now however if the patient is showing signs of decompensation will need to be uncapped.  Fever work-up of course will be continued and treatment appropriately started. 2. Lobar pneumonia follow-up radiologically patient still is having a temperature antibiotics reviewed. 3. Traumatic subarachnoid hemorrhage we will continue with supportive care 4. Multiple closed fractures stabilized 5. Traumatic brain injury grossly unchanged we will  continue to follow along.   I have personally seen and evaluated the patient, evaluated laboratory and imaging results, formulated the assessment and plan and placed orders. The Patient requires high complexity decision making for assessment and support.  Case was discussed on Rounds with the Respiratory Therapy Staff  Allyne Gee, MD [facility] Pulmonary Critical Care Medicine Sleep Medicine

## 2019-03-26 ENCOUNTER — Other Ambulatory Visit (HOSPITAL_COMMUNITY): Payer: BC Managed Care – PPO

## 2019-03-26 DIAGNOSIS — J181 Lobar pneumonia, unspecified organism: Secondary | ICD-10-CM | POA: Diagnosis not present

## 2019-03-26 DIAGNOSIS — S069X9A Unspecified intracranial injury with loss of consciousness of unspecified duration, initial encounter: Secondary | ICD-10-CM | POA: Diagnosis not present

## 2019-03-26 DIAGNOSIS — S129XXS Fracture of neck, unspecified, sequela: Secondary | ICD-10-CM | POA: Diagnosis not present

## 2019-03-26 DIAGNOSIS — J9621 Acute and chronic respiratory failure with hypoxia: Secondary | ICD-10-CM | POA: Diagnosis not present

## 2019-03-26 LAB — CBC
HCT: 30.1 % — ABNORMAL LOW (ref 39.0–52.0)
Hemoglobin: 8.9 g/dL — ABNORMAL LOW (ref 13.0–17.0)
MCH: 25.8 pg — ABNORMAL LOW (ref 26.0–34.0)
MCHC: 29.6 g/dL — ABNORMAL LOW (ref 30.0–36.0)
MCV: 87.2 fL (ref 80.0–100.0)
Platelets: 256 10*3/uL (ref 150–400)
RBC: 3.45 MIL/uL — ABNORMAL LOW (ref 4.22–5.81)
RDW: 17.4 % — ABNORMAL HIGH (ref 11.5–15.5)
WBC: 4.9 10*3/uL (ref 4.0–10.5)
nRBC: 0 % (ref 0.0–0.2)

## 2019-03-26 LAB — BASIC METABOLIC PANEL
Anion gap: 12 (ref 5–15)
BUN: 12 mg/dL (ref 8–23)
CO2: 27 mmol/L (ref 22–32)
Calcium: 8.5 mg/dL — ABNORMAL LOW (ref 8.9–10.3)
Chloride: 97 mmol/L — ABNORMAL LOW (ref 98–111)
Creatinine, Ser: 0.54 mg/dL — ABNORMAL LOW (ref 0.61–1.24)
GFR calc Af Amer: >60 mL/min (ref >60)
GFR calc non Af Amer: >60 mL/min (ref >60)
Glucose, Bld: 161 mg/dL — ABNORMAL HIGH (ref 70–99)
Potassium: 3.9 mmol/L (ref 3.5–5.1)
Sodium: 136 mmol/L (ref 135–145)

## 2019-03-26 LAB — URINE CULTURE: Culture: NO GROWTH

## 2019-03-26 LAB — MAGNESIUM: Magnesium: 2 mg/dL (ref 1.7–2.4)

## 2019-03-26 LAB — PHOSPHORUS: Phosphorus: 4.5 mg/dL (ref 2.5–4.6)

## 2019-03-26 NOTE — Progress Notes (Signed)
Pulmonary Critical Care Medicine [place]   PULMONARY CRITICAL CARE SERVICE  PROGRESS NOTE  Date of Service: 03/26/2019  Nirav Sweda  RCV:[id number]  DOB: 1955-09-10   DOA: 02/17/2019  Referring Physician: Merton Border, MD  HPI: Luke Clayton is a 63 y.o. male seen for follow up of Acute on Chronic Respiratory Failure.  Patient is capping now has been capping for several days and is on room air.  Secretions are reportedly minimal at this time.  Medications: Reviewed on Rounds  Physical Exam:  Vitals: Temperature 97.9 pulse is 98 respiratory rate 21 blood pressure 91/46 saturations 95%  Ventilator Settings capping right now patient is off the ventilator and is also off of oxygen.  . General: Comfortable at this time . Eyes: Grossly normal lids, irises & conjunctiva . ENT: grossly tongue is normal . Neck: no obvious mass . Cardiovascular: S1 S2 normal no gallop . Respiratory: No rhonchi coarse breath sounds are noted . Abdomen: soft . Skin: no rash seen on limited exam . Musculoskeletal: not rigid . Psychiatric:unable to assess . Neurologic: no seizure no involuntary movements         Lab Data:   Basic Metabolic Panel: Recent Labs  Lab 03/20/19 0611 03/22/19 1056 03/24/19 0641 03/25/19 1230 03/26/19 0724  NA 136 135 134*  --  136  K 3.8 3.7 3.4* 3.9 3.9  CL 97* 97* 96*  --  97*  CO2 29 28 26   --  27  GLUCOSE 157* 192* 182*  --  161*  BUN 12 13 17   --  12  CREATININE 0.47* 0.54* 0.70  --  0.54*  CALCIUM 8.8* 8.6* 8.5*  --  8.5*  MG 1.8 1.8 1.8  --  2.0  PHOS 4.3 4.2 4.6  --  4.5    ABG: No results for input(s): PHART, PCO2ART, PO2ART, HCO3, O2SAT in the last 168 hours.  Liver Function Tests: Recent Labs  Lab 03/24/19 0641  ALBUMIN 1.8*   No results for input(s): LIPASE, AMYLASE in the last 168 hours. No results for input(s): AMMONIA in the last 168 hours.  CBC: Recent Labs  Lab 03/20/19 0611 03/22/19 1056 03/24/19 0641  03/26/19 0724  WBC 4.5 4.1 4.7 4.9  HGB 8.8* 8.2* 8.5* 8.9*  HCT 27.7* 26.9* 26.8* 30.1*  MCV 86.0 86.8 86.2 87.2  PLT 195 228 209 256    Cardiac Enzymes: No results for input(s): CKTOTAL, CKMB, CKMBINDEX, TROPONINI in the last 168 hours.  BNP (last 3 results) No results for input(s): BNP in the last 8760 hours.  ProBNP (last 3 results) No results for input(s): PROBNP in the last 8760 hours.  Radiological Exams: No results found.  Assessment/Plan Active Problems:   Acute on chronic respiratory failure with hypoxia (HCC)   Traumatic subarachnoid hemorrhage with loss of consciousness (HCC)   Lobar pneumonia, unspecified organism ([place])   Multiple closed fractures of cervical vertebrae (HCC)   Traumatic brain injury with loss of consciousness of one hour or more ([place])   1. Acute on chronic respiratory failure with hypoxia we will continue with capping for now we are going to try to follow-up on the secretion issue and see if there is potential for decannulation. 2. Traumatic subarachnoid hemorrhage unchanged we will continue with supportive care 3. Lobar pneumonia treated improved 4. Multiple fractures slowly improving 5. Traumatic brain injury patient is more awake and alert.   I have personally seen and evaluated the patient, evaluated laboratory and imaging results, formulated the  assessment and plan and placed orders. The Patient requires high complexity decision making for assessment and support.  Case was discussed on Rounds with the Respiratory Therapy Staff  Allyne Gee, MD [facility] Pulmonary Critical Care Medicine Sleep Medicine

## 2019-03-27 DIAGNOSIS — J181 Lobar pneumonia, unspecified organism: Secondary | ICD-10-CM | POA: Diagnosis not present

## 2019-03-27 DIAGNOSIS — S129XXS Fracture of neck, unspecified, sequela: Secondary | ICD-10-CM | POA: Diagnosis not present

## 2019-03-27 DIAGNOSIS — J9621 Acute and chronic respiratory failure with hypoxia: Secondary | ICD-10-CM | POA: Diagnosis not present

## 2019-03-27 DIAGNOSIS — S069X9A Unspecified intracranial injury with loss of consciousness of unspecified duration, initial encounter: Secondary | ICD-10-CM | POA: Diagnosis not present

## 2019-03-27 NOTE — Progress Notes (Signed)
Pulmonary Critical Care Medicine [place]   PULMONARY CRITICAL CARE SERVICE  PROGRESS NOTE  Date of Service: 03/27/2019  Luke Clayton  UEA:[id number]  DOB: 1955/07/20   DOA: 02/17/2019  Referring Physician: Merton Border, MD  HPI: Luke Clayton is a 63 y.o. male seen for follow up of Acute on Chronic Respiratory Failure.  Remains No distress and is doing well with the capping.  Patient has been on room air.  Patient has been capping now for several days.  Respiratory therapy reports no secretion issues.  Medications: Reviewed on Rounds  Physical Exam:  Vitals: Temperature 98.6 pulse 97 respiratory rate 20 blood pressure 137/87 saturations 95%  Ventilator Settings capping off the ventilator  . General: Comfortable at this time . Eyes: Grossly normal lids, irises & conjunctiva . ENT: grossly tongue is normal . Neck: no obvious mass . Cardiovascular: S1 S2 normal no gallop . Respiratory: No rhonchi no rales are noted at this time . Abdomen: soft . Skin: no rash seen on limited exam . Musculoskeletal: not rigid . Psychiatric:unable to assess . Neurologic: no seizure no involuntary movements         Lab Data:   Basic Metabolic Panel: Recent Labs  Lab 03/22/19 1056 03/24/19 0641 03/25/19 1230 03/26/19 0724  NA 135 134*  --  136  K 3.7 3.4* 3.9 3.9  CL 97* 96*  --  97*  CO2 28 26  --  27  GLUCOSE 192* 182*  --  161*  BUN 13 17  --  12  CREATININE 0.54* 0.70  --  0.54*  CALCIUM 8.6* 8.5*  --  8.5*  MG 1.8 1.8  --  2.0  PHOS 4.2 4.6  --  4.5    ABG: No results for input(s): PHART, PCO2ART, PO2ART, HCO3, O2SAT in the last 168 hours.  Liver Function Tests: Recent Labs  Lab 03/24/19 0641  ALBUMIN 1.8*   No results for input(s): LIPASE, AMYLASE in the last 168 hours. No results for input(s): AMMONIA in the last 168 hours.  CBC: Recent Labs  Lab 03/22/19 1056 03/24/19 0641 03/26/19 0724  WBC 4.1 4.7 4.9  HGB 8.2* 8.5* 8.9*  HCT 26.9*  26.8* 30.1*  MCV 86.8 86.2 87.2  PLT 228 209 256    Cardiac Enzymes: No results for input(s): CKTOTAL, CKMB, CKMBINDEX, TROPONINI in the last 168 hours.  BNP (last 3 results) No results for input(s): BNP in the last 8760 hours.  ProBNP (last 3 results) No results for input(s): PROBNP in the last 8760 hours.  Radiological Exams: Dg Chest Port 1 View  Result Date: 03/26/2019 CLINICAL DATA:  Fever EXAM: PORTABLE CHEST 1 VIEW COMPARISON:  03/17/2019 FINDINGS: Tracheostomy tube in place.  Heart size stable. Slight improved aeration is suggested at the left lung base. Hardware likely from external brace, overlapping the left chest is unchanged from previous study. Signs of prior trauma are noted posterior ribs on the left with healed fractures. IMPRESSION: Still some retrocardiac opacification but with improved aeration since prior study. Electronically Signed   By: Zetta Bills M.D.   On: 03/26/2019 10:44    Assessment/Plan Active Problems:   Acute on chronic respiratory failure with hypoxia (HCC)   Traumatic subarachnoid hemorrhage with loss of consciousness (HCC)   Lobar pneumonia, unspecified organism ([place])   Multiple closed fractures of cervical vertebrae (HCC)   Traumatic brain injury with loss of consciousness of one hour or more ([place])   1. Acute on chronic respiratory failure with  hypoxia we will continue with capping and proceed to decannulation 2. Traumatic subarachnoid hemorrhage unchanged we will continue with supportive care 3. Lobar pneumonia treated we will continue to follow 4. Multiple cervical fractures continue with supportive care 5. Traumatic brain injury patient will need neuro rehab after discharge   I have personally seen and evaluated the patient, evaluated laboratory and imaging results, formulated the assessment and plan and placed orders. The Patient requires high complexity decision making for assessment and support.  Case was discussed on Rounds with  the Respiratory Therapy Staff  Yevonne Pax, MD [facility] Pulmonary Critical Care Medicine Sleep Medicine

## 2019-03-29 LAB — CULTURE, RESPIRATORY W GRAM STAIN

## 2019-03-29 LAB — CULTURE, BLOOD (ROUTINE X 2)
Culture: NO GROWTH
Culture: NO GROWTH
Special Requests: ADEQUATE
Special Requests: ADEQUATE

## 2019-03-30 LAB — BASIC METABOLIC PANEL
Anion gap: 11 (ref 5–15)
BUN: 14 mg/dL (ref 8–23)
CO2: 27 mmol/L (ref 22–32)
Calcium: 8.6 mg/dL — ABNORMAL LOW (ref 8.9–10.3)
Chloride: 98 mmol/L (ref 98–111)
Creatinine, Ser: 0.6 mg/dL — ABNORMAL LOW (ref 0.61–1.24)
GFR calc Af Amer: >60 mL/min (ref >60)
GFR calc non Af Amer: >60 mL/min (ref >60)
Glucose, Bld: 161 mg/dL — ABNORMAL HIGH (ref 70–99)
Potassium: 3.4 mmol/L — ABNORMAL LOW (ref 3.5–5.1)
Sodium: 136 mmol/L (ref 135–145)

## 2019-03-30 LAB — CBC
HCT: 28.9 % — ABNORMAL LOW (ref 39.0–52.0)
Hemoglobin: 8.7 g/dL — ABNORMAL LOW (ref 13.0–17.0)
MCH: 25.9 pg — ABNORMAL LOW (ref 26.0–34.0)
MCHC: 30.1 g/dL (ref 30.0–36.0)
MCV: 86 fL (ref 80.0–100.0)
Platelets: 293 10*3/uL (ref 150–400)
RBC: 3.36 MIL/uL — ABNORMAL LOW (ref 4.22–5.81)
RDW: 17.1 % — ABNORMAL HIGH (ref 11.5–15.5)
WBC: 5.5 10*3/uL (ref 4.0–10.5)
nRBC: 0 % (ref 0.0–0.2)

## 2019-03-30 LAB — MAGNESIUM: Magnesium: 1.8 mg/dL (ref 1.7–2.4)

## 2019-03-31 LAB — CBC
HCT: 29.6 % — ABNORMAL LOW (ref 39.0–52.0)
Hemoglobin: 8.8 g/dL — ABNORMAL LOW (ref 13.0–17.0)
MCH: 25.7 pg — ABNORMAL LOW (ref 26.0–34.0)
MCHC: 29.7 g/dL — ABNORMAL LOW (ref 30.0–36.0)
MCV: 86.3 fL (ref 80.0–100.0)
Platelets: 307 10*3/uL (ref 150–400)
RBC: 3.43 MIL/uL — ABNORMAL LOW (ref 4.22–5.81)
RDW: 17.1 % — ABNORMAL HIGH (ref 11.5–15.5)
WBC: 4.8 10*3/uL (ref 4.0–10.5)
nRBC: 0 % (ref 0.0–0.2)

## 2019-03-31 LAB — BASIC METABOLIC PANEL
Anion gap: 13 (ref 5–15)
BUN: 13 mg/dL (ref 8–23)
CO2: 24 mmol/L (ref 22–32)
Calcium: 8.7 mg/dL — ABNORMAL LOW (ref 8.9–10.3)
Chloride: 99 mmol/L (ref 98–111)
Creatinine, Ser: 0.61 mg/dL (ref 0.61–1.24)
GFR calc Af Amer: >60 mL/min (ref >60)
GFR calc non Af Amer: >60 mL/min (ref >60)
Glucose, Bld: 156 mg/dL — ABNORMAL HIGH (ref 70–99)
Potassium: 3.7 mmol/L (ref 3.5–5.1)
Sodium: 136 mmol/L (ref 135–145)

## 2019-04-03 LAB — BASIC METABOLIC PANEL
Anion gap: 10 (ref 5–15)
BUN: 14 mg/dL (ref 8–23)
CO2: 29 mmol/L (ref 22–32)
Calcium: 8.8 mg/dL — ABNORMAL LOW (ref 8.9–10.3)
Chloride: 99 mmol/L (ref 98–111)
Creatinine, Ser: 0.71 mg/dL (ref 0.61–1.24)
GFR calc Af Amer: >60 mL/min (ref >60)
GFR calc non Af Amer: >60 mL/min (ref >60)
Glucose, Bld: 183 mg/dL — ABNORMAL HIGH (ref 70–99)
Potassium: 3.6 mmol/L (ref 3.5–5.1)
Sodium: 138 mmol/L (ref 135–145)

## 2019-04-03 LAB — CBC
HCT: 32.1 % — ABNORMAL LOW (ref 39.0–52.0)
Hemoglobin: 9.5 g/dL — ABNORMAL LOW (ref 13.0–17.0)
MCH: 25.4 pg — ABNORMAL LOW (ref 26.0–34.0)
MCHC: 29.6 g/dL — ABNORMAL LOW (ref 30.0–36.0)
MCV: 85.8 fL (ref 80.0–100.0)
Platelets: 329 10*3/uL (ref 150–400)
RBC: 3.74 MIL/uL — ABNORMAL LOW (ref 4.22–5.81)
RDW: 16.8 % — ABNORMAL HIGH (ref 11.5–15.5)
WBC: 6.2 10*3/uL (ref 4.0–10.5)
nRBC: 0 % (ref 0.0–0.2)

## 2019-04-03 LAB — PHOSPHORUS: Phosphorus: 4.3 mg/dL (ref 2.5–4.6)

## 2019-04-03 LAB — MAGNESIUM: Magnesium: 1.9 mg/dL (ref 1.7–2.4)

## 2019-04-04 LAB — URINALYSIS, ROUTINE W REFLEX MICROSCOPIC
Bilirubin Urine: NEGATIVE
Glucose, UA: NEGATIVE mg/dL
Hgb urine dipstick: NEGATIVE
Ketones, ur: NEGATIVE mg/dL
Leukocytes,Ua: NEGATIVE
Nitrite: NEGATIVE
Protein, ur: NEGATIVE mg/dL
Specific Gravity, Urine: 1.016 (ref 1.005–1.030)
pH: 6 (ref 5.0–8.0)
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
## 2019-04-05 ENCOUNTER — Other Ambulatory Visit (HOSPITAL_COMMUNITY): Payer: BC Managed Care – PPO

## 2019-04-05 LAB — URINE CULTURE: Culture: NO GROWTH

## 2019-04-06 LAB — BASIC METABOLIC PANEL
Anion gap: 7 (ref 5–15)
BUN: 16 mg/dL (ref 8–23)
CO2: 30 mmol/L (ref 22–32)
Calcium: 8.6 mg/dL — ABNORMAL LOW (ref 8.9–10.3)
Chloride: 104 mmol/L (ref 98–111)
Creatinine, Ser: 0.64 mg/dL (ref 0.61–1.24)
GFR calc Af Amer: >60 mL/min (ref >60)
GFR calc non Af Amer: >60 mL/min (ref >60)
Glucose, Bld: 164 mg/dL — ABNORMAL HIGH (ref 70–99)
Potassium: 3.5 mmol/L (ref 3.5–5.1)
Sodium: 141 mmol/L (ref 135–145)

## 2019-04-06 LAB — CBC
HCT: 29.3 % — ABNORMAL LOW (ref 39.0–52.0)
Hemoglobin: 8.6 g/dL — ABNORMAL LOW (ref 13.0–17.0)
MCH: 25.2 pg — ABNORMAL LOW (ref 26.0–34.0)
MCHC: 29.4 g/dL — ABNORMAL LOW (ref 30.0–36.0)
MCV: 85.9 fL (ref 80.0–100.0)
Platelets: 257 10*3/uL (ref 150–400)
RBC: 3.41 MIL/uL — ABNORMAL LOW (ref 4.22–5.81)
RDW: 16.4 % — ABNORMAL HIGH (ref 11.5–15.5)
WBC: 3.7 10*3/uL — ABNORMAL LOW (ref 4.0–10.5)
nRBC: 0 % (ref 0.0–0.2)

## 2019-04-06 LAB — MAGNESIUM: Magnesium: 1.9 mg/dL (ref 1.7–2.4)

## 2019-04-06 LAB — PHOSPHORUS: Phosphorus: 3.9 mg/dL (ref 2.5–4.6)

## 2019-04-06 LAB — VANCOMYCIN, TROUGH: Vancomycin Tr: 14 ug/mL — ABNORMAL LOW (ref 15–20)

## 2019-04-07 LAB — AEROBIC CULTURE W GRAM STAIN (SUPERFICIAL SPECIMEN): Gram Stain: NONE SEEN
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
## 2019-04-08 ENCOUNTER — Other Ambulatory Visit (HOSPITAL_COMMUNITY): Payer: BC Managed Care – PPO

## 2019-04-08 LAB — CULTURE, RESPIRATORY W GRAM STAIN

## 2019-04-08 LAB — VANCOMYCIN, TROUGH: Vancomycin Tr: 16 ug/mL (ref 15–20)

## 2019-04-08 NOTE — Procedures (Signed)
Echo attempted again. Patient has cervical collar and thoracic collar extending to subcostal area. Echo not possible with collar in place. I told nurse that I would inform Dr. Doylene Canard that collar need to be removed to complete echo. Notified Dr. Cyndie Chime via text message.

## 2019-04-08 NOTE — Procedures (Signed)
Echo attempted. Patient and bed being changed. Will attempt again later.

## 2019-04-09 ENCOUNTER — Ambulatory Visit (HOSPITAL_COMMUNITY): Payer: BC Managed Care – PPO

## 2019-04-09 LAB — CULTURE, BLOOD (ROUTINE X 2)
Culture: NO GROWTH
Culture: NO GROWTH
Special Requests: ADEQUATE
Special Requests: ADEQUATE

## 2019-04-09 LAB — BASIC METABOLIC PANEL
Anion gap: 9 (ref 5–15)
BUN: 11 mg/dL (ref 8–23)
CO2: 29 mmol/L (ref 22–32)
Calcium: 8.8 mg/dL — ABNORMAL LOW (ref 8.9–10.3)
Chloride: 105 mmol/L (ref 98–111)
Creatinine, Ser: 1.21 mg/dL (ref 0.61–1.24)
GFR calc Af Amer: >60 mL/min (ref >60)
GFR calc non Af Amer: >60 mL/min (ref >60)
Glucose, Bld: 156 mg/dL — ABNORMAL HIGH (ref 70–99)
Potassium: 3.3 mmol/L — ABNORMAL LOW (ref 3.5–5.1)
Sodium: 143 mmol/L (ref 135–145)

## 2019-04-09 LAB — CBC
HCT: 31.4 % — ABNORMAL LOW (ref 39.0–52.0)
Hemoglobin: 9.2 g/dL — ABNORMAL LOW (ref 13.0–17.0)
MCH: 24.5 pg — ABNORMAL LOW (ref 26.0–34.0)
MCHC: 29.3 g/dL — ABNORMAL LOW (ref 30.0–36.0)
MCV: 83.7 fL (ref 80.0–100.0)
Platelets: 249 10*3/uL (ref 150–400)
RBC: 3.75 MIL/uL — ABNORMAL LOW (ref 4.22–5.81)
RDW: 16.5 % — ABNORMAL HIGH (ref 11.5–15.5)
WBC: 4.8 10*3/uL (ref 4.0–10.5)
nRBC: 0 % (ref 0.0–0.2)

## 2019-04-09 LAB — PHOSPHORUS: Phosphorus: 3.3 mg/dL (ref 2.5–4.6)

## 2019-04-09 LAB — MAGNESIUM: Magnesium: 1.9 mg/dL (ref 1.7–2.4)

## 2019-04-09 LAB — PROCALCITONIN: Procalcitonin: <0.1 ng/mL

## 2019-04-09 NOTE — Progress Notes (Signed)
  Echocardiogram 2D Echocardiogram has been performed.  Luke Clayton 04/09/2019, 2:42 PM

## 2019-04-09 NOTE — Progress Notes (Signed)
  Echocardiogram 2D Echocardiogram has been performed.  Technically difficulty study due to patient wearing cervical thoracic brace.   Marge Vandermeulen L Androw 04/09/2019, 2:45 PM

## 2019-04-10 LAB — BASIC METABOLIC PANEL
Anion gap: 10 (ref 5–15)
BUN: 10 mg/dL (ref 8–23)
CO2: 29 mmol/L (ref 22–32)
Calcium: 8.9 mg/dL (ref 8.9–10.3)
Chloride: 104 mmol/L (ref 98–111)
Creatinine, Ser: 0.59 mg/dL — ABNORMAL LOW (ref 0.61–1.24)
GFR calc Af Amer: >60 mL/min (ref >60)
GFR calc non Af Amer: >60 mL/min (ref >60)
Glucose, Bld: 143 mg/dL — ABNORMAL HIGH (ref 70–99)
Potassium: 3.3 mmol/L — ABNORMAL LOW (ref 3.5–5.1)
Sodium: 143 mmol/L (ref 135–145)

## 2019-04-10 LAB — ECHOCARDIOGRAM LIMITED
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
## 2019-04-11 LAB — POTASSIUM: Potassium: 3.5 mmol/L (ref 3.5–5.1)

## 2019-04-12 LAB — VANCOMYCIN, TROUGH: Vancomycin Tr: 22 ug/mL (ref 15–20)

## 2019-04-13 LAB — CBC
HCT: 31.5 % — ABNORMAL LOW (ref 39.0–52.0)
Hemoglobin: 9.6 g/dL — ABNORMAL LOW (ref 13.0–17.0)
MCH: 25.7 pg — ABNORMAL LOW (ref 26.0–34.0)
MCHC: 30.5 g/dL (ref 30.0–36.0)
MCV: 84.5 fL (ref 80.0–100.0)
Platelets: 204 10*3/uL (ref 150–400)
RBC: 3.73 MIL/uL — ABNORMAL LOW (ref 4.22–5.81)
RDW: 16.5 % — ABNORMAL HIGH (ref 11.5–15.5)
WBC: 3.2 10*3/uL — ABNORMAL LOW (ref 4.0–10.5)
nRBC: 0 % (ref 0.0–0.2)

## 2019-04-13 LAB — MAGNESIUM: Magnesium: 2.1 mg/dL (ref 1.7–2.4)

## 2019-04-13 LAB — BASIC METABOLIC PANEL
Anion gap: 10 (ref 5–15)
BUN: 12 mg/dL (ref 8–23)
CO2: 29 mmol/L (ref 22–32)
Calcium: 9.1 mg/dL (ref 8.9–10.3)
Chloride: 103 mmol/L (ref 98–111)
Creatinine, Ser: 0.77 mg/dL (ref 0.61–1.24)
GFR calc Af Amer: >60 mL/min (ref >60)
GFR calc non Af Amer: >60 mL/min (ref >60)
Glucose, Bld: 148 mg/dL — ABNORMAL HIGH (ref 70–99)
Potassium: 3.5 mmol/L (ref 3.5–5.1)
Sodium: 142 mmol/L (ref 135–145)

## 2019-04-13 LAB — VANCOMYCIN, TROUGH: Vancomycin Tr: 11 ug/mL — ABNORMAL LOW (ref 15–20)

## 2019-04-14 LAB — SARS CORONAVIRUS 2 BY RT PCR (HOSPITAL ORDER, PERFORMED IN ~~LOC~~ HOSPITAL LAB): SARS Coronavirus 2: NEGATIVE

## 2019-04-15 LAB — CBC
HCT: 30.9 % — ABNORMAL LOW (ref 39.0–52.0)
Hemoglobin: 9 g/dL — ABNORMAL LOW (ref 13.0–17.0)
MCH: 24.7 pg — ABNORMAL LOW (ref 26.0–34.0)
MCHC: 29.1 g/dL — ABNORMAL LOW (ref 30.0–36.0)
MCV: 84.7 fL (ref 80.0–100.0)
Platelets: 215 10*3/uL (ref 150–400)
RBC: 3.65 MIL/uL — ABNORMAL LOW (ref 4.22–5.81)
RDW: 16.3 % — ABNORMAL HIGH (ref 11.5–15.5)
WBC: 3.2 10*3/uL — ABNORMAL LOW (ref 4.0–10.5)
nRBC: 0 % (ref 0.0–0.2)

## 2019-05-04 DIAGNOSIS — J9621 Acute and chronic respiratory failure with hypoxia: Secondary | ICD-10-CM

## 2019-05-04 DIAGNOSIS — T07XXXA Unspecified multiple injuries, initial encounter: Secondary | ICD-10-CM

## 2019-05-04 DIAGNOSIS — S069X9D Unspecified intracranial injury with loss of consciousness of unspecified duration, subsequent encounter: Secondary | ICD-10-CM

## 2019-05-04 DIAGNOSIS — J690 Pneumonitis due to inhalation of food and vomit: Secondary | ICD-10-CM

## 2019-05-05 DIAGNOSIS — J9621 Acute and chronic respiratory failure with hypoxia: Secondary | ICD-10-CM

## 2019-05-05 DIAGNOSIS — J690 Pneumonitis due to inhalation of food and vomit: Secondary | ICD-10-CM

## 2019-05-06 DIAGNOSIS — J690 Pneumonitis due to inhalation of food and vomit: Secondary | ICD-10-CM | POA: Diagnosis not present

## 2019-05-06 DIAGNOSIS — J9621 Acute and chronic respiratory failure with hypoxia: Secondary | ICD-10-CM | POA: Diagnosis not present

## 2019-05-14 DIAGNOSIS — I5022 Chronic systolic (congestive) heart failure: Secondary | ICD-10-CM

## 2019-05-14 DIAGNOSIS — T07XXXA Unspecified multiple injuries, initial encounter: Secondary | ICD-10-CM

## 2019-05-14 DIAGNOSIS — S062X9D Diffuse traumatic brain injury with loss of consciousness of unspecified duration, subsequent encounter: Secondary | ICD-10-CM

## 2019-05-14 DIAGNOSIS — J9621 Acute and chronic respiratory failure with hypoxia: Secondary | ICD-10-CM | POA: Diagnosis not present

## 2019-05-15 DIAGNOSIS — S062X9D Diffuse traumatic brain injury with loss of consciousness of unspecified duration, subsequent encounter: Secondary | ICD-10-CM

## 2019-05-15 DIAGNOSIS — J9621 Acute and chronic respiratory failure with hypoxia: Secondary | ICD-10-CM | POA: Diagnosis not present

## 2019-05-15 DIAGNOSIS — T07XXXA Unspecified multiple injuries, initial encounter: Secondary | ICD-10-CM

## 2019-05-15 DIAGNOSIS — I5022 Chronic systolic (congestive) heart failure: Secondary | ICD-10-CM

## 2019-05-16 DIAGNOSIS — I5022 Chronic systolic (congestive) heart failure: Secondary | ICD-10-CM

## 2019-05-16 DIAGNOSIS — S062X9D Diffuse traumatic brain injury with loss of consciousness of unspecified duration, subsequent encounter: Secondary | ICD-10-CM

## 2019-05-16 DIAGNOSIS — T07XXXA Unspecified multiple injuries, initial encounter: Secondary | ICD-10-CM

## 2019-05-16 DIAGNOSIS — J9621 Acute and chronic respiratory failure with hypoxia: Secondary | ICD-10-CM | POA: Diagnosis not present

## 2019-05-17 DIAGNOSIS — T07XXXA Unspecified multiple injuries, initial encounter: Secondary | ICD-10-CM

## 2019-05-17 DIAGNOSIS — J9621 Acute and chronic respiratory failure with hypoxia: Secondary | ICD-10-CM

## 2019-05-17 DIAGNOSIS — I5022 Chronic systolic (congestive) heart failure: Secondary | ICD-10-CM

## 2019-05-17 DIAGNOSIS — S062X9D Diffuse traumatic brain injury with loss of consciousness of unspecified duration, subsequent encounter: Secondary | ICD-10-CM

## 2019-05-18 DIAGNOSIS — T07XXXA Unspecified multiple injuries, initial encounter: Secondary | ICD-10-CM

## 2019-05-18 DIAGNOSIS — J9621 Acute and chronic respiratory failure with hypoxia: Secondary | ICD-10-CM

## 2019-05-18 DIAGNOSIS — S062X9D Diffuse traumatic brain injury with loss of consciousness of unspecified duration, subsequent encounter: Secondary | ICD-10-CM

## 2019-05-18 DIAGNOSIS — I5022 Chronic systolic (congestive) heart failure: Secondary | ICD-10-CM

## 2019-05-19 DIAGNOSIS — T07XXXA Unspecified multiple injuries, initial encounter: Secondary | ICD-10-CM

## 2019-05-19 DIAGNOSIS — S062X9D Diffuse traumatic brain injury with loss of consciousness of unspecified duration, subsequent encounter: Secondary | ICD-10-CM

## 2019-05-19 DIAGNOSIS — J9621 Acute and chronic respiratory failure with hypoxia: Secondary | ICD-10-CM

## 2019-05-19 DIAGNOSIS — I5022 Chronic systolic (congestive) heart failure: Secondary | ICD-10-CM

## 2019-05-20 DIAGNOSIS — S062X9D Diffuse traumatic brain injury with loss of consciousness of unspecified duration, subsequent encounter: Secondary | ICD-10-CM

## 2019-05-20 DIAGNOSIS — T07XXXA Unspecified multiple injuries, initial encounter: Secondary | ICD-10-CM

## 2019-05-20 DIAGNOSIS — J9621 Acute and chronic respiratory failure with hypoxia: Secondary | ICD-10-CM | POA: Diagnosis not present

## 2019-05-20 DIAGNOSIS — I5022 Chronic systolic (congestive) heart failure: Secondary | ICD-10-CM

## 2019-05-28 DIAGNOSIS — I5022 Chronic systolic (congestive) heart failure: Secondary | ICD-10-CM | POA: Diagnosis not present

## 2019-05-28 DIAGNOSIS — T07XXXA Unspecified multiple injuries, initial encounter: Secondary | ICD-10-CM

## 2019-05-28 DIAGNOSIS — J9621 Acute and chronic respiratory failure with hypoxia: Secondary | ICD-10-CM

## 2019-05-28 DIAGNOSIS — S062X9D Diffuse traumatic brain injury with loss of consciousness of unspecified duration, subsequent encounter: Secondary | ICD-10-CM | POA: Diagnosis not present

## 2019-05-29 DIAGNOSIS — S062X9D Diffuse traumatic brain injury with loss of consciousness of unspecified duration, subsequent encounter: Secondary | ICD-10-CM | POA: Diagnosis not present

## 2019-05-29 DIAGNOSIS — I5022 Chronic systolic (congestive) heart failure: Secondary | ICD-10-CM | POA: Diagnosis not present

## 2019-05-29 DIAGNOSIS — J9621 Acute and chronic respiratory failure with hypoxia: Secondary | ICD-10-CM | POA: Diagnosis not present

## 2019-05-29 DIAGNOSIS — T07XXXA Unspecified multiple injuries, initial encounter: Secondary | ICD-10-CM

## 2019-05-30 DIAGNOSIS — S062X9D Diffuse traumatic brain injury with loss of consciousness of unspecified duration, subsequent encounter: Secondary | ICD-10-CM | POA: Diagnosis not present

## 2019-05-30 DIAGNOSIS — I5022 Chronic systolic (congestive) heart failure: Secondary | ICD-10-CM | POA: Diagnosis not present

## 2019-05-30 DIAGNOSIS — J9621 Acute and chronic respiratory failure with hypoxia: Secondary | ICD-10-CM | POA: Diagnosis not present

## 2019-05-30 DIAGNOSIS — T07XXXA Unspecified multiple injuries, initial encounter: Secondary | ICD-10-CM | POA: Diagnosis not present

## 2019-05-31 DIAGNOSIS — T07XXXA Unspecified multiple injuries, initial encounter: Secondary | ICD-10-CM | POA: Diagnosis not present

## 2019-05-31 DIAGNOSIS — S062X9D Diffuse traumatic brain injury with loss of consciousness of unspecified duration, subsequent encounter: Secondary | ICD-10-CM

## 2019-05-31 DIAGNOSIS — J9621 Acute and chronic respiratory failure with hypoxia: Secondary | ICD-10-CM | POA: Diagnosis not present

## 2019-05-31 DIAGNOSIS — I5022 Chronic systolic (congestive) heart failure: Secondary | ICD-10-CM | POA: Diagnosis not present

## 2019-06-01 DIAGNOSIS — I5022 Chronic systolic (congestive) heart failure: Secondary | ICD-10-CM

## 2019-06-01 DIAGNOSIS — J9621 Acute and chronic respiratory failure with hypoxia: Secondary | ICD-10-CM

## 2019-06-01 DIAGNOSIS — T07XXXA Unspecified multiple injuries, initial encounter: Secondary | ICD-10-CM

## 2019-06-01 DIAGNOSIS — S062X9D Diffuse traumatic brain injury with loss of consciousness of unspecified duration, subsequent encounter: Secondary | ICD-10-CM | POA: Diagnosis not present

## 2019-06-02 DIAGNOSIS — S062X9D Diffuse traumatic brain injury with loss of consciousness of unspecified duration, subsequent encounter: Secondary | ICD-10-CM | POA: Diagnosis not present

## 2019-06-02 DIAGNOSIS — I5022 Chronic systolic (congestive) heart failure: Secondary | ICD-10-CM | POA: Diagnosis not present

## 2019-06-02 DIAGNOSIS — J9621 Acute and chronic respiratory failure with hypoxia: Secondary | ICD-10-CM | POA: Diagnosis not present

## 2019-06-02 DIAGNOSIS — T07XXXA Unspecified multiple injuries, initial encounter: Secondary | ICD-10-CM | POA: Diagnosis not present

## 2019-06-03 DIAGNOSIS — T07XXXA Unspecified multiple injuries, initial encounter: Secondary | ICD-10-CM

## 2019-06-03 DIAGNOSIS — J9621 Acute and chronic respiratory failure with hypoxia: Secondary | ICD-10-CM | POA: Diagnosis not present

## 2019-06-03 DIAGNOSIS — I5022 Chronic systolic (congestive) heart failure: Secondary | ICD-10-CM

## 2019-06-03 DIAGNOSIS — S062X9D Diffuse traumatic brain injury with loss of consciousness of unspecified duration, subsequent encounter: Secondary | ICD-10-CM

## 2019-06-11 DIAGNOSIS — S062X9D Diffuse traumatic brain injury with loss of consciousness of unspecified duration, subsequent encounter: Secondary | ICD-10-CM

## 2019-06-11 DIAGNOSIS — T07XXXA Unspecified multiple injuries, initial encounter: Secondary | ICD-10-CM

## 2019-06-11 DIAGNOSIS — I5022 Chronic systolic (congestive) heart failure: Secondary | ICD-10-CM

## 2019-06-11 DIAGNOSIS — J9621 Acute and chronic respiratory failure with hypoxia: Secondary | ICD-10-CM

## 2019-06-12 DIAGNOSIS — S062X9D Diffuse traumatic brain injury with loss of consciousness of unspecified duration, subsequent encounter: Secondary | ICD-10-CM | POA: Diagnosis not present

## 2019-06-12 DIAGNOSIS — J9621 Acute and chronic respiratory failure with hypoxia: Secondary | ICD-10-CM

## 2019-06-12 DIAGNOSIS — T07XXXA Unspecified multiple injuries, initial encounter: Secondary | ICD-10-CM

## 2019-06-12 DIAGNOSIS — I5022 Chronic systolic (congestive) heart failure: Secondary | ICD-10-CM

## 2019-06-13 DIAGNOSIS — J9621 Acute and chronic respiratory failure with hypoxia: Secondary | ICD-10-CM | POA: Diagnosis not present

## 2019-06-13 DIAGNOSIS — I5022 Chronic systolic (congestive) heart failure: Secondary | ICD-10-CM

## 2019-06-13 DIAGNOSIS — S062X9D Diffuse traumatic brain injury with loss of consciousness of unspecified duration, subsequent encounter: Secondary | ICD-10-CM | POA: Diagnosis not present

## 2019-06-13 DIAGNOSIS — T07XXXA Unspecified multiple injuries, initial encounter: Secondary | ICD-10-CM | POA: Diagnosis not present

## 2019-06-14 DIAGNOSIS — T07XXXA Unspecified multiple injuries, initial encounter: Secondary | ICD-10-CM

## 2019-06-14 DIAGNOSIS — S062X9D Diffuse traumatic brain injury with loss of consciousness of unspecified duration, subsequent encounter: Secondary | ICD-10-CM

## 2019-06-14 DIAGNOSIS — J9621 Acute and chronic respiratory failure with hypoxia: Secondary | ICD-10-CM | POA: Diagnosis not present

## 2019-06-14 DIAGNOSIS — I5022 Chronic systolic (congestive) heart failure: Secondary | ICD-10-CM | POA: Diagnosis not present

## 2019-06-15 DIAGNOSIS — I5022 Chronic systolic (congestive) heart failure: Secondary | ICD-10-CM

## 2019-06-15 DIAGNOSIS — S062X9D Diffuse traumatic brain injury with loss of consciousness of unspecified duration, subsequent encounter: Secondary | ICD-10-CM

## 2019-06-15 DIAGNOSIS — J9621 Acute and chronic respiratory failure with hypoxia: Secondary | ICD-10-CM | POA: Diagnosis not present

## 2019-06-15 DIAGNOSIS — T07XXXA Unspecified multiple injuries, initial encounter: Secondary | ICD-10-CM

## 2019-06-16 DIAGNOSIS — T07XXXA Unspecified multiple injuries, initial encounter: Secondary | ICD-10-CM | POA: Diagnosis not present

## 2019-06-16 DIAGNOSIS — S062X9D Diffuse traumatic brain injury with loss of consciousness of unspecified duration, subsequent encounter: Secondary | ICD-10-CM

## 2019-06-16 DIAGNOSIS — I5022 Chronic systolic (congestive) heart failure: Secondary | ICD-10-CM | POA: Diagnosis not present

## 2019-06-16 DIAGNOSIS — J9621 Acute and chronic respiratory failure with hypoxia: Secondary | ICD-10-CM

## 2019-06-17 DIAGNOSIS — I5022 Chronic systolic (congestive) heart failure: Secondary | ICD-10-CM

## 2019-06-17 DIAGNOSIS — S062X9D Diffuse traumatic brain injury with loss of consciousness of unspecified duration, subsequent encounter: Secondary | ICD-10-CM | POA: Diagnosis not present

## 2019-06-17 DIAGNOSIS — J9621 Acute and chronic respiratory failure with hypoxia: Secondary | ICD-10-CM

## 2019-06-17 DIAGNOSIS — T07XXXA Unspecified multiple injuries, initial encounter: Secondary | ICD-10-CM | POA: Diagnosis not present

## 2019-06-25 DIAGNOSIS — I5022 Chronic systolic (congestive) heart failure: Secondary | ICD-10-CM

## 2019-06-25 DIAGNOSIS — J9621 Acute and chronic respiratory failure with hypoxia: Secondary | ICD-10-CM

## 2019-06-25 DIAGNOSIS — S062X9D Diffuse traumatic brain injury with loss of consciousness of unspecified duration, subsequent encounter: Secondary | ICD-10-CM

## 2019-06-25 DIAGNOSIS — T07XXXA Unspecified multiple injuries, initial encounter: Secondary | ICD-10-CM

## 2019-06-26 DIAGNOSIS — J9621 Acute and chronic respiratory failure with hypoxia: Secondary | ICD-10-CM

## 2019-06-26 DIAGNOSIS — S062X9D Diffuse traumatic brain injury with loss of consciousness of unspecified duration, subsequent encounter: Secondary | ICD-10-CM

## 2019-06-26 DIAGNOSIS — I5022 Chronic systolic (congestive) heart failure: Secondary | ICD-10-CM

## 2019-06-26 DIAGNOSIS — T07XXXA Unspecified multiple injuries, initial encounter: Secondary | ICD-10-CM

## 2019-06-27 DIAGNOSIS — S062X9D Diffuse traumatic brain injury with loss of consciousness of unspecified duration, subsequent encounter: Secondary | ICD-10-CM

## 2019-06-27 DIAGNOSIS — T07XXXA Unspecified multiple injuries, initial encounter: Secondary | ICD-10-CM

## 2019-06-27 DIAGNOSIS — I5022 Chronic systolic (congestive) heart failure: Secondary | ICD-10-CM

## 2019-06-27 DIAGNOSIS — J9621 Acute and chronic respiratory failure with hypoxia: Secondary | ICD-10-CM

## 2019-06-28 DIAGNOSIS — I5022 Chronic systolic (congestive) heart failure: Secondary | ICD-10-CM

## 2019-06-28 DIAGNOSIS — S062X9D Diffuse traumatic brain injury with loss of consciousness of unspecified duration, subsequent encounter: Secondary | ICD-10-CM

## 2019-06-28 DIAGNOSIS — T07XXXA Unspecified multiple injuries, initial encounter: Secondary | ICD-10-CM

## 2019-06-28 DIAGNOSIS — J9621 Acute and chronic respiratory failure with hypoxia: Secondary | ICD-10-CM

## 2019-06-29 DIAGNOSIS — T07XXXA Unspecified multiple injuries, initial encounter: Secondary | ICD-10-CM

## 2019-06-29 DIAGNOSIS — J9621 Acute and chronic respiratory failure with hypoxia: Secondary | ICD-10-CM

## 2019-06-29 DIAGNOSIS — I5022 Chronic systolic (congestive) heart failure: Secondary | ICD-10-CM

## 2019-06-29 DIAGNOSIS — S062X9D Diffuse traumatic brain injury with loss of consciousness of unspecified duration, subsequent encounter: Secondary | ICD-10-CM

## 2019-06-30 DIAGNOSIS — I5022 Chronic systolic (congestive) heart failure: Secondary | ICD-10-CM

## 2019-06-30 DIAGNOSIS — J9621 Acute and chronic respiratory failure with hypoxia: Secondary | ICD-10-CM

## 2019-06-30 DIAGNOSIS — S062X9D Diffuse traumatic brain injury with loss of consciousness of unspecified duration, subsequent encounter: Secondary | ICD-10-CM

## 2019-06-30 DIAGNOSIS — T07XXXA Unspecified multiple injuries, initial encounter: Secondary | ICD-10-CM

## 2019-07-01 DIAGNOSIS — T07XXXA Unspecified multiple injuries, initial encounter: Secondary | ICD-10-CM

## 2019-07-01 DIAGNOSIS — I5022 Chronic systolic (congestive) heart failure: Secondary | ICD-10-CM

## 2019-07-01 DIAGNOSIS — S062X9D Diffuse traumatic brain injury with loss of consciousness of unspecified duration, subsequent encounter: Secondary | ICD-10-CM

## 2019-07-01 DIAGNOSIS — J9621 Acute and chronic respiratory failure with hypoxia: Secondary | ICD-10-CM

## 2019-07-09 DIAGNOSIS — J9621 Acute and chronic respiratory failure with hypoxia: Secondary | ICD-10-CM

## 2019-07-09 DIAGNOSIS — I5022 Chronic systolic (congestive) heart failure: Secondary | ICD-10-CM

## 2019-07-09 DIAGNOSIS — T07XXXA Unspecified multiple injuries, initial encounter: Secondary | ICD-10-CM

## 2019-07-09 DIAGNOSIS — S062X9D Diffuse traumatic brain injury with loss of consciousness of unspecified duration, subsequent encounter: Secondary | ICD-10-CM

## 2019-07-10 DIAGNOSIS — S062X9D Diffuse traumatic brain injury with loss of consciousness of unspecified duration, subsequent encounter: Secondary | ICD-10-CM

## 2019-07-10 DIAGNOSIS — T07XXXA Unspecified multiple injuries, initial encounter: Secondary | ICD-10-CM | POA: Diagnosis not present

## 2019-07-10 DIAGNOSIS — J9621 Acute and chronic respiratory failure with hypoxia: Secondary | ICD-10-CM

## 2019-07-10 DIAGNOSIS — I5022 Chronic systolic (congestive) heart failure: Secondary | ICD-10-CM

## 2019-07-11 DIAGNOSIS — J9621 Acute and chronic respiratory failure with hypoxia: Secondary | ICD-10-CM | POA: Diagnosis not present

## 2019-07-11 DIAGNOSIS — S062X9D Diffuse traumatic brain injury with loss of consciousness of unspecified duration, subsequent encounter: Secondary | ICD-10-CM

## 2019-07-11 DIAGNOSIS — I5022 Chronic systolic (congestive) heart failure: Secondary | ICD-10-CM

## 2019-07-11 DIAGNOSIS — T07XXXA Unspecified multiple injuries, initial encounter: Secondary | ICD-10-CM

## 2019-07-12 DIAGNOSIS — I5022 Chronic systolic (congestive) heart failure: Secondary | ICD-10-CM

## 2019-07-12 DIAGNOSIS — T07XXXA Unspecified multiple injuries, initial encounter: Secondary | ICD-10-CM

## 2019-07-12 DIAGNOSIS — J9621 Acute and chronic respiratory failure with hypoxia: Secondary | ICD-10-CM

## 2019-07-12 DIAGNOSIS — S062X9D Diffuse traumatic brain injury with loss of consciousness of unspecified duration, subsequent encounter: Secondary | ICD-10-CM

## 2019-07-20 DIAGNOSIS — S062X9D Diffuse traumatic brain injury with loss of consciousness of unspecified duration, subsequent encounter: Secondary | ICD-10-CM | POA: Diagnosis not present

## 2019-07-20 DIAGNOSIS — J9621 Acute and chronic respiratory failure with hypoxia: Secondary | ICD-10-CM

## 2019-07-20 DIAGNOSIS — I5022 Chronic systolic (congestive) heart failure: Secondary | ICD-10-CM | POA: Diagnosis not present

## 2019-07-20 DIAGNOSIS — T07XXXA Unspecified multiple injuries, initial encounter: Secondary | ICD-10-CM | POA: Diagnosis not present

## 2019-07-21 DIAGNOSIS — S062X9D Diffuse traumatic brain injury with loss of consciousness of unspecified duration, subsequent encounter: Secondary | ICD-10-CM

## 2019-07-21 DIAGNOSIS — I5022 Chronic systolic (congestive) heart failure: Secondary | ICD-10-CM

## 2019-07-21 DIAGNOSIS — T07XXXA Unspecified multiple injuries, initial encounter: Secondary | ICD-10-CM

## 2019-07-21 DIAGNOSIS — J9621 Acute and chronic respiratory failure with hypoxia: Secondary | ICD-10-CM | POA: Diagnosis not present

## 2019-07-22 DIAGNOSIS — J9621 Acute and chronic respiratory failure with hypoxia: Secondary | ICD-10-CM | POA: Diagnosis not present

## 2019-07-22 DIAGNOSIS — T07XXXA Unspecified multiple injuries, initial encounter: Secondary | ICD-10-CM

## 2019-07-22 DIAGNOSIS — S062X9D Diffuse traumatic brain injury with loss of consciousness of unspecified duration, subsequent encounter: Secondary | ICD-10-CM

## 2019-07-22 DIAGNOSIS — I5022 Chronic systolic (congestive) heart failure: Secondary | ICD-10-CM

## 2019-07-23 DIAGNOSIS — I5022 Chronic systolic (congestive) heart failure: Secondary | ICD-10-CM | POA: Diagnosis not present

## 2019-07-23 DIAGNOSIS — T07XXXA Unspecified multiple injuries, initial encounter: Secondary | ICD-10-CM | POA: Diagnosis not present

## 2019-07-23 DIAGNOSIS — J9621 Acute and chronic respiratory failure with hypoxia: Secondary | ICD-10-CM | POA: Diagnosis not present

## 2019-07-23 DIAGNOSIS — S062X9D Diffuse traumatic brain injury with loss of consciousness of unspecified duration, subsequent encounter: Secondary | ICD-10-CM | POA: Diagnosis not present

## 2019-07-24 DIAGNOSIS — J9621 Acute and chronic respiratory failure with hypoxia: Secondary | ICD-10-CM | POA: Diagnosis not present

## 2019-07-24 DIAGNOSIS — T07XXXA Unspecified multiple injuries, initial encounter: Secondary | ICD-10-CM

## 2019-07-24 DIAGNOSIS — S062X9D Diffuse traumatic brain injury with loss of consciousness of unspecified duration, subsequent encounter: Secondary | ICD-10-CM

## 2019-07-24 DIAGNOSIS — I5022 Chronic systolic (congestive) heart failure: Secondary | ICD-10-CM | POA: Diagnosis not present

## 2019-07-25 DIAGNOSIS — S062X9D Diffuse traumatic brain injury with loss of consciousness of unspecified duration, subsequent encounter: Secondary | ICD-10-CM | POA: Diagnosis not present

## 2019-07-25 DIAGNOSIS — T07XXXA Unspecified multiple injuries, initial encounter: Secondary | ICD-10-CM | POA: Diagnosis not present

## 2019-07-25 DIAGNOSIS — J9621 Acute and chronic respiratory failure with hypoxia: Secondary | ICD-10-CM | POA: Diagnosis not present

## 2019-07-25 DIAGNOSIS — I5022 Chronic systolic (congestive) heart failure: Secondary | ICD-10-CM | POA: Diagnosis not present

## 2019-07-26 DIAGNOSIS — S062X9D Diffuse traumatic brain injury with loss of consciousness of unspecified duration, subsequent encounter: Secondary | ICD-10-CM | POA: Diagnosis not present

## 2019-07-26 DIAGNOSIS — T07XXXA Unspecified multiple injuries, initial encounter: Secondary | ICD-10-CM | POA: Diagnosis not present

## 2019-07-26 DIAGNOSIS — I5022 Chronic systolic (congestive) heart failure: Secondary | ICD-10-CM | POA: Diagnosis not present

## 2019-07-26 DIAGNOSIS — J9621 Acute and chronic respiratory failure with hypoxia: Secondary | ICD-10-CM | POA: Diagnosis not present

## 2019-07-27 DIAGNOSIS — T07XXXA Unspecified multiple injuries, initial encounter: Secondary | ICD-10-CM

## 2019-07-27 DIAGNOSIS — I5022 Chronic systolic (congestive) heart failure: Secondary | ICD-10-CM

## 2019-07-27 DIAGNOSIS — S062X9D Diffuse traumatic brain injury with loss of consciousness of unspecified duration, subsequent encounter: Secondary | ICD-10-CM

## 2019-07-27 DIAGNOSIS — J9621 Acute and chronic respiratory failure with hypoxia: Secondary | ICD-10-CM

## 2019-07-28 DIAGNOSIS — I5022 Chronic systolic (congestive) heart failure: Secondary | ICD-10-CM

## 2019-07-28 DIAGNOSIS — S062X9D Diffuse traumatic brain injury with loss of consciousness of unspecified duration, subsequent encounter: Secondary | ICD-10-CM | POA: Diagnosis not present

## 2019-07-28 DIAGNOSIS — J9621 Acute and chronic respiratory failure with hypoxia: Secondary | ICD-10-CM | POA: Diagnosis not present

## 2019-07-28 DIAGNOSIS — T07XXXA Unspecified multiple injuries, initial encounter: Secondary | ICD-10-CM | POA: Diagnosis not present

## 2019-07-29 DIAGNOSIS — S062X9D Diffuse traumatic brain injury with loss of consciousness of unspecified duration, subsequent encounter: Secondary | ICD-10-CM

## 2019-07-29 DIAGNOSIS — J9621 Acute and chronic respiratory failure with hypoxia: Secondary | ICD-10-CM | POA: Diagnosis not present

## 2019-07-29 DIAGNOSIS — I5022 Chronic systolic (congestive) heart failure: Secondary | ICD-10-CM

## 2019-07-29 DIAGNOSIS — T07XXXA Unspecified multiple injuries, initial encounter: Secondary | ICD-10-CM

## 2019-08-06 DIAGNOSIS — S062X9D Diffuse traumatic brain injury with loss of consciousness of unspecified duration, subsequent encounter: Secondary | ICD-10-CM | POA: Diagnosis not present

## 2019-08-06 DIAGNOSIS — J9621 Acute and chronic respiratory failure with hypoxia: Secondary | ICD-10-CM | POA: Diagnosis not present

## 2019-08-06 DIAGNOSIS — I5022 Chronic systolic (congestive) heart failure: Secondary | ICD-10-CM | POA: Diagnosis not present

## 2019-08-06 DIAGNOSIS — T07XXXA Unspecified multiple injuries, initial encounter: Secondary | ICD-10-CM | POA: Diagnosis not present

## 2019-08-07 DIAGNOSIS — S062X9D Diffuse traumatic brain injury with loss of consciousness of unspecified duration, subsequent encounter: Secondary | ICD-10-CM | POA: Diagnosis not present

## 2019-08-07 DIAGNOSIS — I5022 Chronic systolic (congestive) heart failure: Secondary | ICD-10-CM | POA: Diagnosis not present

## 2019-08-07 DIAGNOSIS — T07XXXA Unspecified multiple injuries, initial encounter: Secondary | ICD-10-CM | POA: Diagnosis not present

## 2019-08-07 DIAGNOSIS — J9621 Acute and chronic respiratory failure with hypoxia: Secondary | ICD-10-CM | POA: Diagnosis not present

## 2019-08-08 DIAGNOSIS — J9621 Acute and chronic respiratory failure with hypoxia: Secondary | ICD-10-CM | POA: Diagnosis not present

## 2019-08-08 DIAGNOSIS — I5022 Chronic systolic (congestive) heart failure: Secondary | ICD-10-CM | POA: Diagnosis not present

## 2019-08-08 DIAGNOSIS — S062X9D Diffuse traumatic brain injury with loss of consciousness of unspecified duration, subsequent encounter: Secondary | ICD-10-CM | POA: Diagnosis not present

## 2019-08-08 DIAGNOSIS — T07XXXA Unspecified multiple injuries, initial encounter: Secondary | ICD-10-CM | POA: Diagnosis not present

## 2019-08-09 DIAGNOSIS — T07XXXA Unspecified multiple injuries, initial encounter: Secondary | ICD-10-CM | POA: Diagnosis not present

## 2019-08-09 DIAGNOSIS — I5022 Chronic systolic (congestive) heart failure: Secondary | ICD-10-CM | POA: Diagnosis not present

## 2019-08-09 DIAGNOSIS — J9621 Acute and chronic respiratory failure with hypoxia: Secondary | ICD-10-CM | POA: Diagnosis not present

## 2019-08-09 DIAGNOSIS — S062X9D Diffuse traumatic brain injury with loss of consciousness of unspecified duration, subsequent encounter: Secondary | ICD-10-CM | POA: Diagnosis not present

## 2019-08-10 DIAGNOSIS — S062X9D Diffuse traumatic brain injury with loss of consciousness of unspecified duration, subsequent encounter: Secondary | ICD-10-CM | POA: Diagnosis not present

## 2019-08-10 DIAGNOSIS — I5022 Chronic systolic (congestive) heart failure: Secondary | ICD-10-CM | POA: Diagnosis not present

## 2019-08-10 DIAGNOSIS — J9621 Acute and chronic respiratory failure with hypoxia: Secondary | ICD-10-CM | POA: Diagnosis not present

## 2019-08-10 DIAGNOSIS — T07XXXA Unspecified multiple injuries, initial encounter: Secondary | ICD-10-CM | POA: Diagnosis not present

## 2019-08-11 DIAGNOSIS — I5022 Chronic systolic (congestive) heart failure: Secondary | ICD-10-CM | POA: Diagnosis not present

## 2019-08-11 DIAGNOSIS — S062X9D Diffuse traumatic brain injury with loss of consciousness of unspecified duration, subsequent encounter: Secondary | ICD-10-CM | POA: Diagnosis not present

## 2019-08-11 DIAGNOSIS — J9621 Acute and chronic respiratory failure with hypoxia: Secondary | ICD-10-CM | POA: Diagnosis not present

## 2019-08-11 DIAGNOSIS — T07XXXA Unspecified multiple injuries, initial encounter: Secondary | ICD-10-CM | POA: Diagnosis not present

## 2019-08-12 DIAGNOSIS — I5022 Chronic systolic (congestive) heart failure: Secondary | ICD-10-CM | POA: Diagnosis not present

## 2019-08-12 DIAGNOSIS — J9621 Acute and chronic respiratory failure with hypoxia: Secondary | ICD-10-CM | POA: Diagnosis not present

## 2019-08-12 DIAGNOSIS — T07XXXA Unspecified multiple injuries, initial encounter: Secondary | ICD-10-CM

## 2019-08-12 DIAGNOSIS — S062X9D Diffuse traumatic brain injury with loss of consciousness of unspecified duration, subsequent encounter: Secondary | ICD-10-CM | POA: Diagnosis not present

## 2019-08-17 DIAGNOSIS — I5022 Chronic systolic (congestive) heart failure: Secondary | ICD-10-CM | POA: Diagnosis not present

## 2019-08-17 DIAGNOSIS — S062X9D Diffuse traumatic brain injury with loss of consciousness of unspecified duration, subsequent encounter: Secondary | ICD-10-CM | POA: Diagnosis not present

## 2019-08-17 DIAGNOSIS — J9621 Acute and chronic respiratory failure with hypoxia: Secondary | ICD-10-CM | POA: Diagnosis not present

## 2019-08-17 DIAGNOSIS — T07XXXA Unspecified multiple injuries, initial encounter: Secondary | ICD-10-CM | POA: Diagnosis not present

## 2019-08-18 DIAGNOSIS — J9621 Acute and chronic respiratory failure with hypoxia: Secondary | ICD-10-CM | POA: Diagnosis not present

## 2019-08-18 DIAGNOSIS — S062X9D Diffuse traumatic brain injury with loss of consciousness of unspecified duration, subsequent encounter: Secondary | ICD-10-CM | POA: Diagnosis not present

## 2019-08-18 DIAGNOSIS — I5022 Chronic systolic (congestive) heart failure: Secondary | ICD-10-CM | POA: Diagnosis not present

## 2019-08-18 DIAGNOSIS — T07XXXA Unspecified multiple injuries, initial encounter: Secondary | ICD-10-CM | POA: Diagnosis not present

## 2019-08-19 DIAGNOSIS — T07XXXA Unspecified multiple injuries, initial encounter: Secondary | ICD-10-CM | POA: Diagnosis not present

## 2019-08-19 DIAGNOSIS — S062X9D Diffuse traumatic brain injury with loss of consciousness of unspecified duration, subsequent encounter: Secondary | ICD-10-CM | POA: Diagnosis not present

## 2019-08-19 DIAGNOSIS — J9621 Acute and chronic respiratory failure with hypoxia: Secondary | ICD-10-CM | POA: Diagnosis not present

## 2019-08-19 DIAGNOSIS — I5022 Chronic systolic (congestive) heart failure: Secondary | ICD-10-CM | POA: Diagnosis not present

## 2019-08-20 DIAGNOSIS — J9621 Acute and chronic respiratory failure with hypoxia: Secondary | ICD-10-CM

## 2019-08-20 DIAGNOSIS — S062X9D Diffuse traumatic brain injury with loss of consciousness of unspecified duration, subsequent encounter: Secondary | ICD-10-CM

## 2019-08-20 DIAGNOSIS — T07XXXA Unspecified multiple injuries, initial encounter: Secondary | ICD-10-CM

## 2019-08-20 DIAGNOSIS — I5022 Chronic systolic (congestive) heart failure: Secondary | ICD-10-CM

## 2019-08-21 DIAGNOSIS — T07XXXA Unspecified multiple injuries, initial encounter: Secondary | ICD-10-CM

## 2019-08-21 DIAGNOSIS — S062X9D Diffuse traumatic brain injury with loss of consciousness of unspecified duration, subsequent encounter: Secondary | ICD-10-CM

## 2019-08-21 DIAGNOSIS — J9621 Acute and chronic respiratory failure with hypoxia: Secondary | ICD-10-CM

## 2019-08-21 DIAGNOSIS — I5022 Chronic systolic (congestive) heart failure: Secondary | ICD-10-CM

## 2019-08-22 DIAGNOSIS — I5022 Chronic systolic (congestive) heart failure: Secondary | ICD-10-CM

## 2019-08-22 DIAGNOSIS — S062X9D Diffuse traumatic brain injury with loss of consciousness of unspecified duration, subsequent encounter: Secondary | ICD-10-CM

## 2019-08-22 DIAGNOSIS — T07XXXA Unspecified multiple injuries, initial encounter: Secondary | ICD-10-CM

## 2019-08-22 DIAGNOSIS — J9621 Acute and chronic respiratory failure with hypoxia: Secondary | ICD-10-CM

## 2021-07-08 IMAGING — CT CT ABDOMEN WITHOUT CONTRAST
2 of 4 series · 15 of 46 positions shown, 17 images · non-contrast
Comparison: None.

CLINICAL DATA: Respiratory failure, preop planning for gastrostomy
placement

EXAM:
CT ABDOMEN WITHOUT CONTRAST
TECHNIQUE: Multidetector CT imaging of the abdomen was performed following the
standard protocol without IV contrast.

[Series 3: a/p w/o 5mm · axial · non-contrast · 0.77mm/px · z∈[+1375,+1615]mm · 12 of 54 slices shown, 14 images]
[im 3/54  soft-tissue]
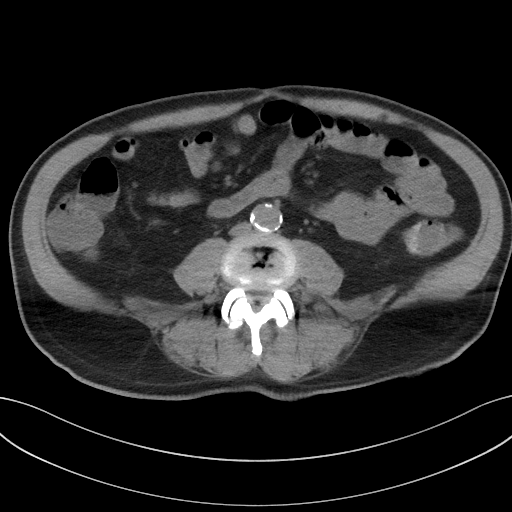
[im 3/54  bone]
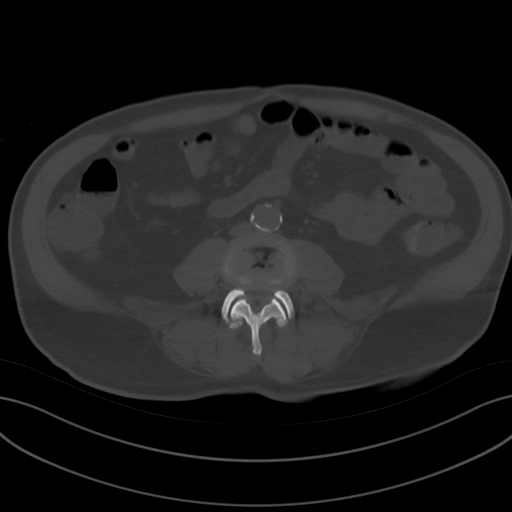
[im 8/54  soft-tissue]
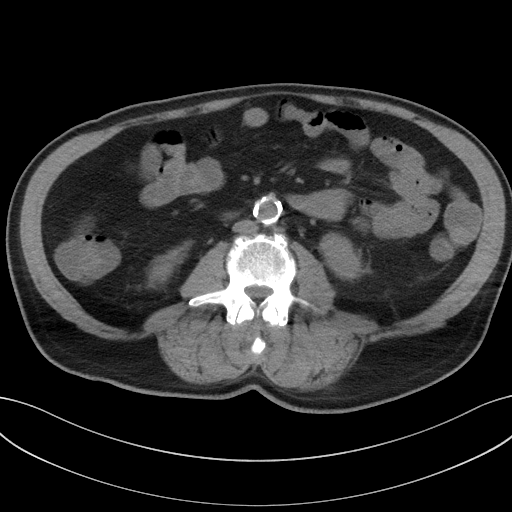
[im 13/54  soft-tissue]
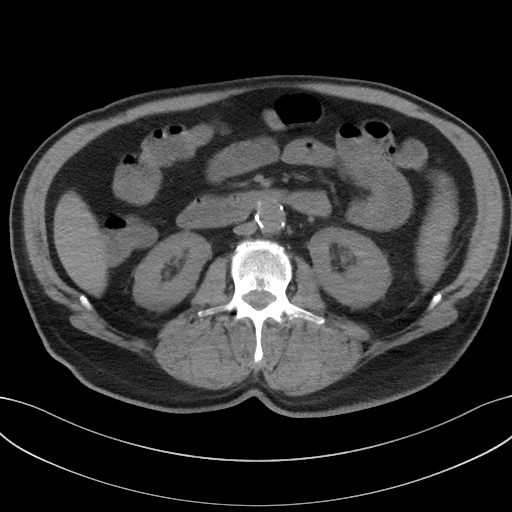
[im 17/54  soft-tissue]
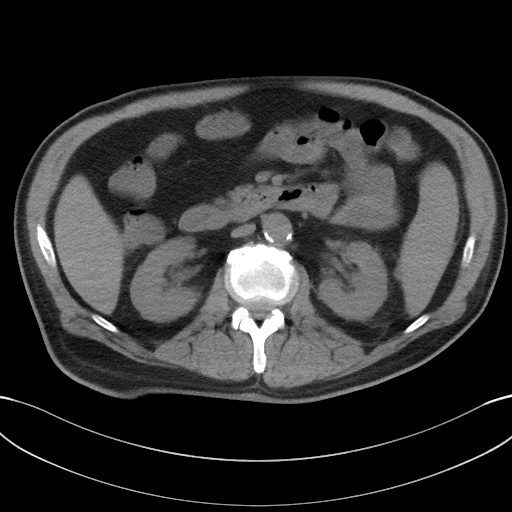
[im 20/54  soft-tissue]
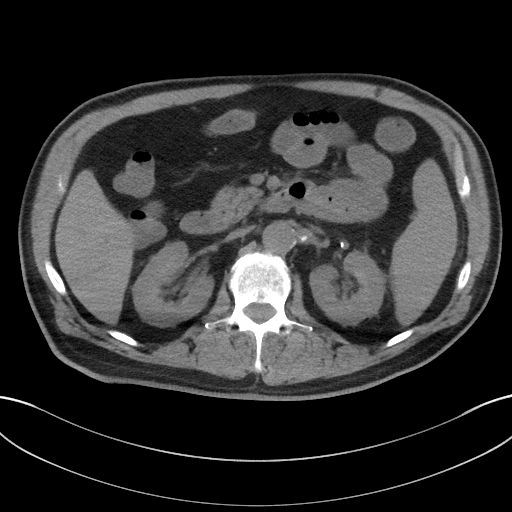
[im 25/54  soft-tissue]
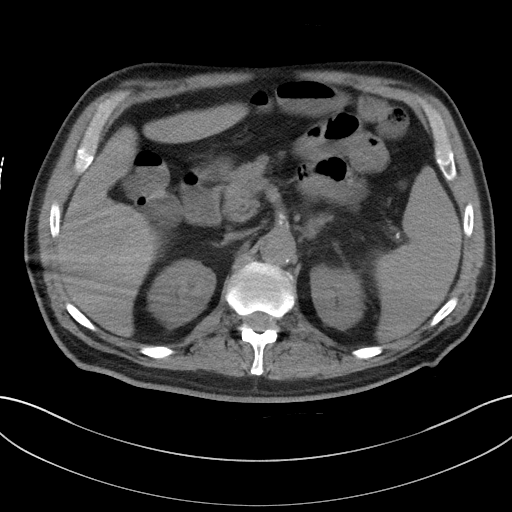
[im 29/54  soft-tissue]
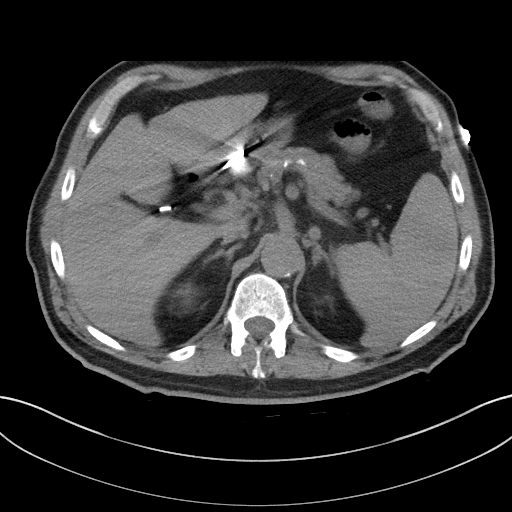
[im 34/54  soft-tissue]
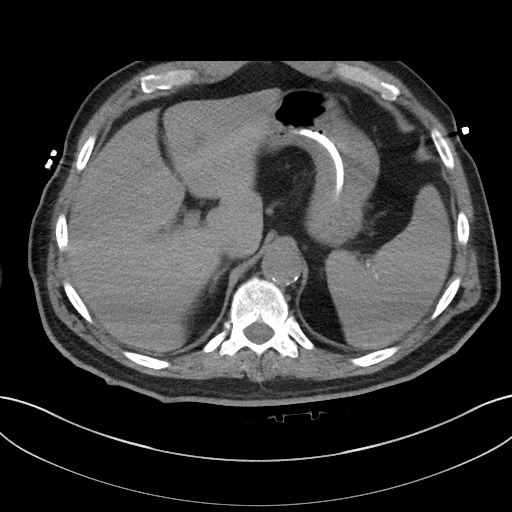
[im 37/54  soft-tissue]
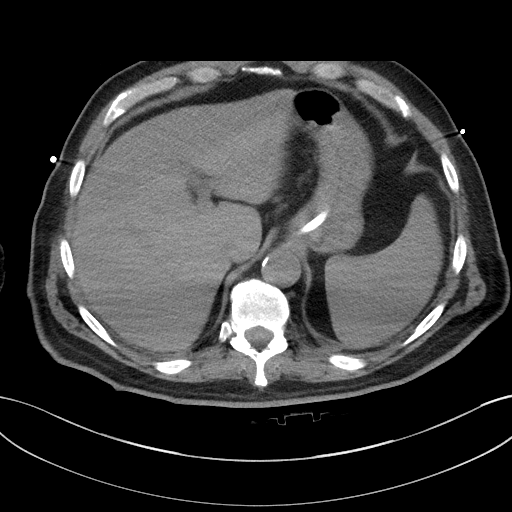
[im 37/54  bone]
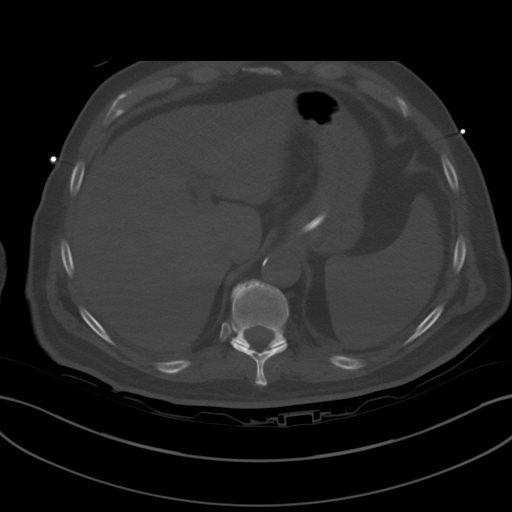
[im 41/54  soft-tissue]
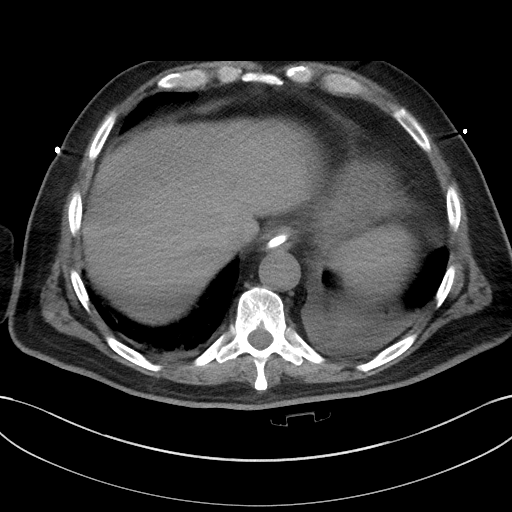
[im 46/54  soft-tissue]
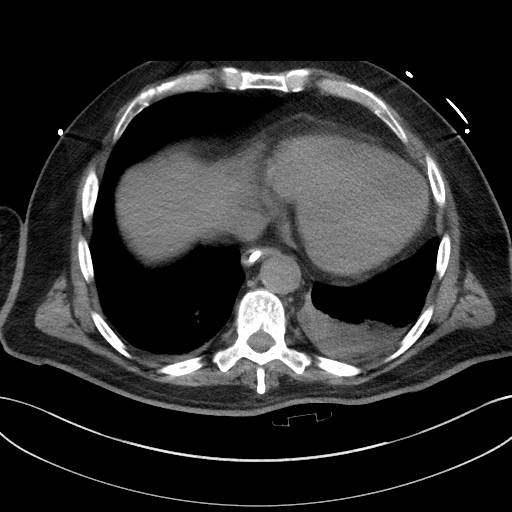
[im 51/54  soft-tissue]
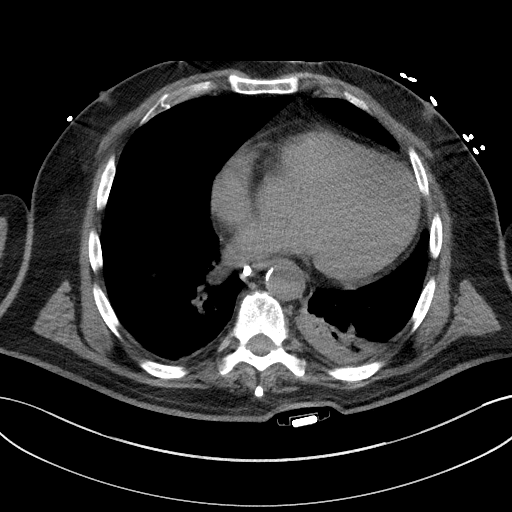

[Series 6: abd w/o cor · coronal · non-contrast · 0.52mm/px · 3 of 151 slices shown]
[im 51/151  soft-tissue]
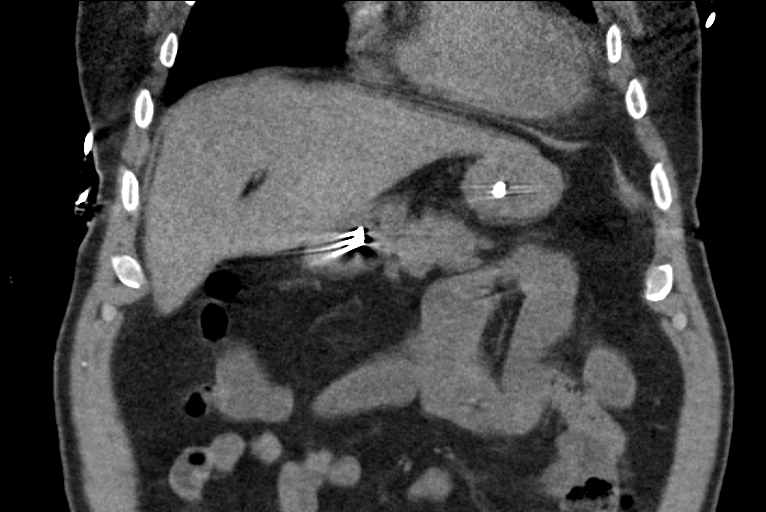
[im 67/151  soft-tissue]
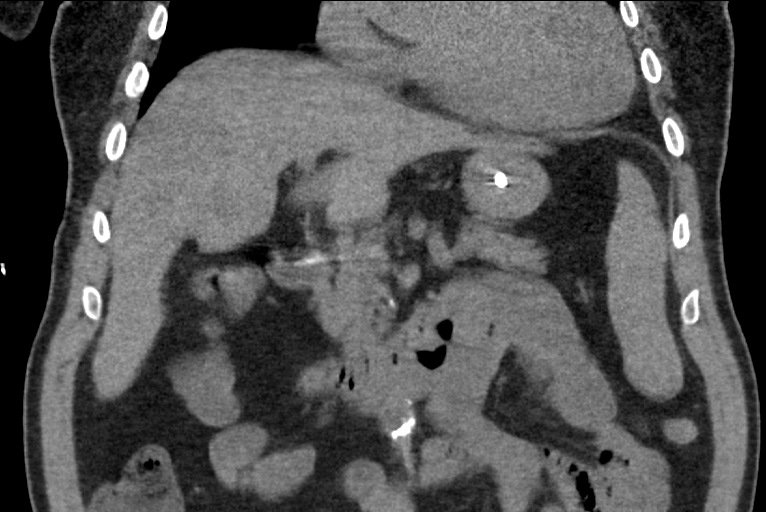
[im 84/151  soft-tissue]
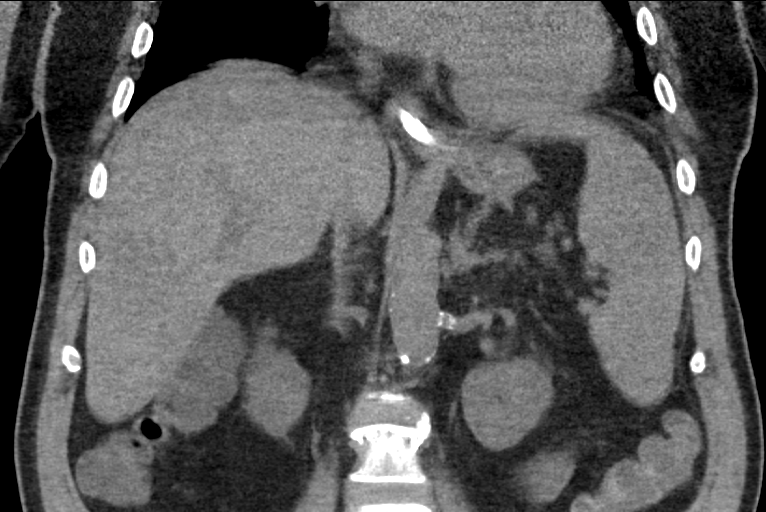

[15 of 46 positions shown; findings below may reference images not displayed]

FINDINGS: Lower chest: Consolidation/atelectasis in the posterior left lower
lobe. Aortic Atherosclerosis ([id number]).

Hepatobiliary: No focal liver abnormality is seen. Status post
cholecystectomy. No biliary dilatation.

Pancreas: Unremarkable. No pancreatic ductal dilatation or
surrounding inflammatory changes.

Spleen: Normal in size without focal abnormality.

Adrenals/Urinary Tract: Adrenal glands are unremarkable. Kidneys are
normal, without renal calculi, focal lesion, or hydronephrosis.

Stomach/Bowel: Feeding tube extends to the pylorus. The stomach is
decompressed. There is a safe percutaneous window for gastrostomy
placement. Visualized portions of small bowel and colon
unremarkable.

Vascular/Lymphatic: Ectatic atheromatous aorta. No abdominal or
mesenteric adenopathy.

Other: No ascites. No free air.

Musculoskeletal: Spondylitic changes in the visualized lumbar spine.
No fracture or worrisome bone lesion.
IMPRESSION: 1. There is a safe percutaneous window for gastrostomy placement.
2. Consolidation/atelectasis in the posterior left lower lobe.
3. Ectatic abdominal aorta at risk for aneurysm development.
Recommend followup by ultrasound in 5 years. This recommendation
follows ACR consensus guidelines: White Paper of the ACR Incidental

## 2022-09-03 DEATH — deceased
